# Patient Record
Sex: Male | Born: 1960
Health system: Southern US, Community
[De-identification: ages and names within clinical notes are randomized; demographics above are authoritative.]

## PROBLEM LIST (undated history)

## (undated) DIAGNOSIS — T8859XA Other complications of anesthesia, initial encounter: Secondary | ICD-10-CM

## (undated) DIAGNOSIS — Z955 Presence of coronary angioplasty implant and graft: Secondary | ICD-10-CM

## (undated) DIAGNOSIS — I2510 Atherosclerotic heart disease of native coronary artery without angina pectoris: Secondary | ICD-10-CM

## (undated) DIAGNOSIS — I10 Essential (primary) hypertension: Secondary | ICD-10-CM

## (undated) DIAGNOSIS — I38 Endocarditis, valve unspecified: Secondary | ICD-10-CM

## (undated) DIAGNOSIS — I5030 Unspecified diastolic (congestive) heart failure: Secondary | ICD-10-CM

## (undated) DIAGNOSIS — N261 Atrophy of kidney (terminal): Secondary | ICD-10-CM

## (undated) DIAGNOSIS — E785 Hyperlipidemia, unspecified: Secondary | ICD-10-CM

## (undated) DIAGNOSIS — I219 Acute myocardial infarction, unspecified: Secondary | ICD-10-CM

## (undated) DIAGNOSIS — M51379 Other intervertebral disc degeneration, lumbosacral region without mention of lumbar back pain or lower extremity pain: Secondary | ICD-10-CM

## (undated) DIAGNOSIS — Z7901 Long term (current) use of anticoagulants: Secondary | ICD-10-CM

## (undated) DIAGNOSIS — G479 Sleep disorder, unspecified: Secondary | ICD-10-CM

## (undated) DIAGNOSIS — M5137 Other intervertebral disc degeneration, lumbosacral region: Secondary | ICD-10-CM

## (undated) DIAGNOSIS — N400 Enlarged prostate without lower urinary tract symptoms: Secondary | ICD-10-CM

## (undated) HISTORY — PX: CORONARY ANGIOPLASTY: SHX604

## (undated) HISTORY — PX: ANGIOPLASTY / STENTING ILIAC: SUR31

## (undated) HISTORY — PX: COLONOSCOPY WITH ESOPHAGOGASTRODUODENOSCOPY (EGD): SHX5779

## (undated) HISTORY — PX: KNEE ARTHROSCOPY: SHX127

---

## 1995-02-08 HISTORY — PX: MENISCECTOMY: SHX123

## 2001-07-12 DIAGNOSIS — I2109 ST elevation (STEMI) myocardial infarction involving other coronary artery of anterior wall: Secondary | ICD-10-CM

## 2001-07-12 HISTORY — DX: ST elevation (STEMI) myocardial infarction involving other coronary artery of anterior wall: I21.09

## 2001-07-12 HISTORY — PX: CORONARY ANGIOPLASTY WITH STENT PLACEMENT: SHX49

## 2005-11-25 HISTORY — PX: CORONARY ANGIOPLASTY WITH STENT PLACEMENT: SHX49

## 2005-12-23 ENCOUNTER — Ambulatory Visit: Payer: Self-pay | Admitting: Internal Medicine

## 2005-12-23 IMAGING — CR ORBITS FOR FOREIGN BODY - 2 VIEW
1 series · 3 of 3 positions shown · non-contrast
Comparison: none

REASON FOR EXAM: R/O ANY METAL IN EYES
COMMENTS:

PROCEDURE:     DXR - DXR ORBITS FOR MRI CLEARANCE  - [DATE] [DATE]
RESULT:     Three views of the orbits were obtained prior to MRI
examination.  No metallic foreign body or other abnormality is seen that
would preclude MRI evaluation.

[Series 1: view not recorded · 0.17mm/px · 3 of 3 slices shown]
[im 1/3]
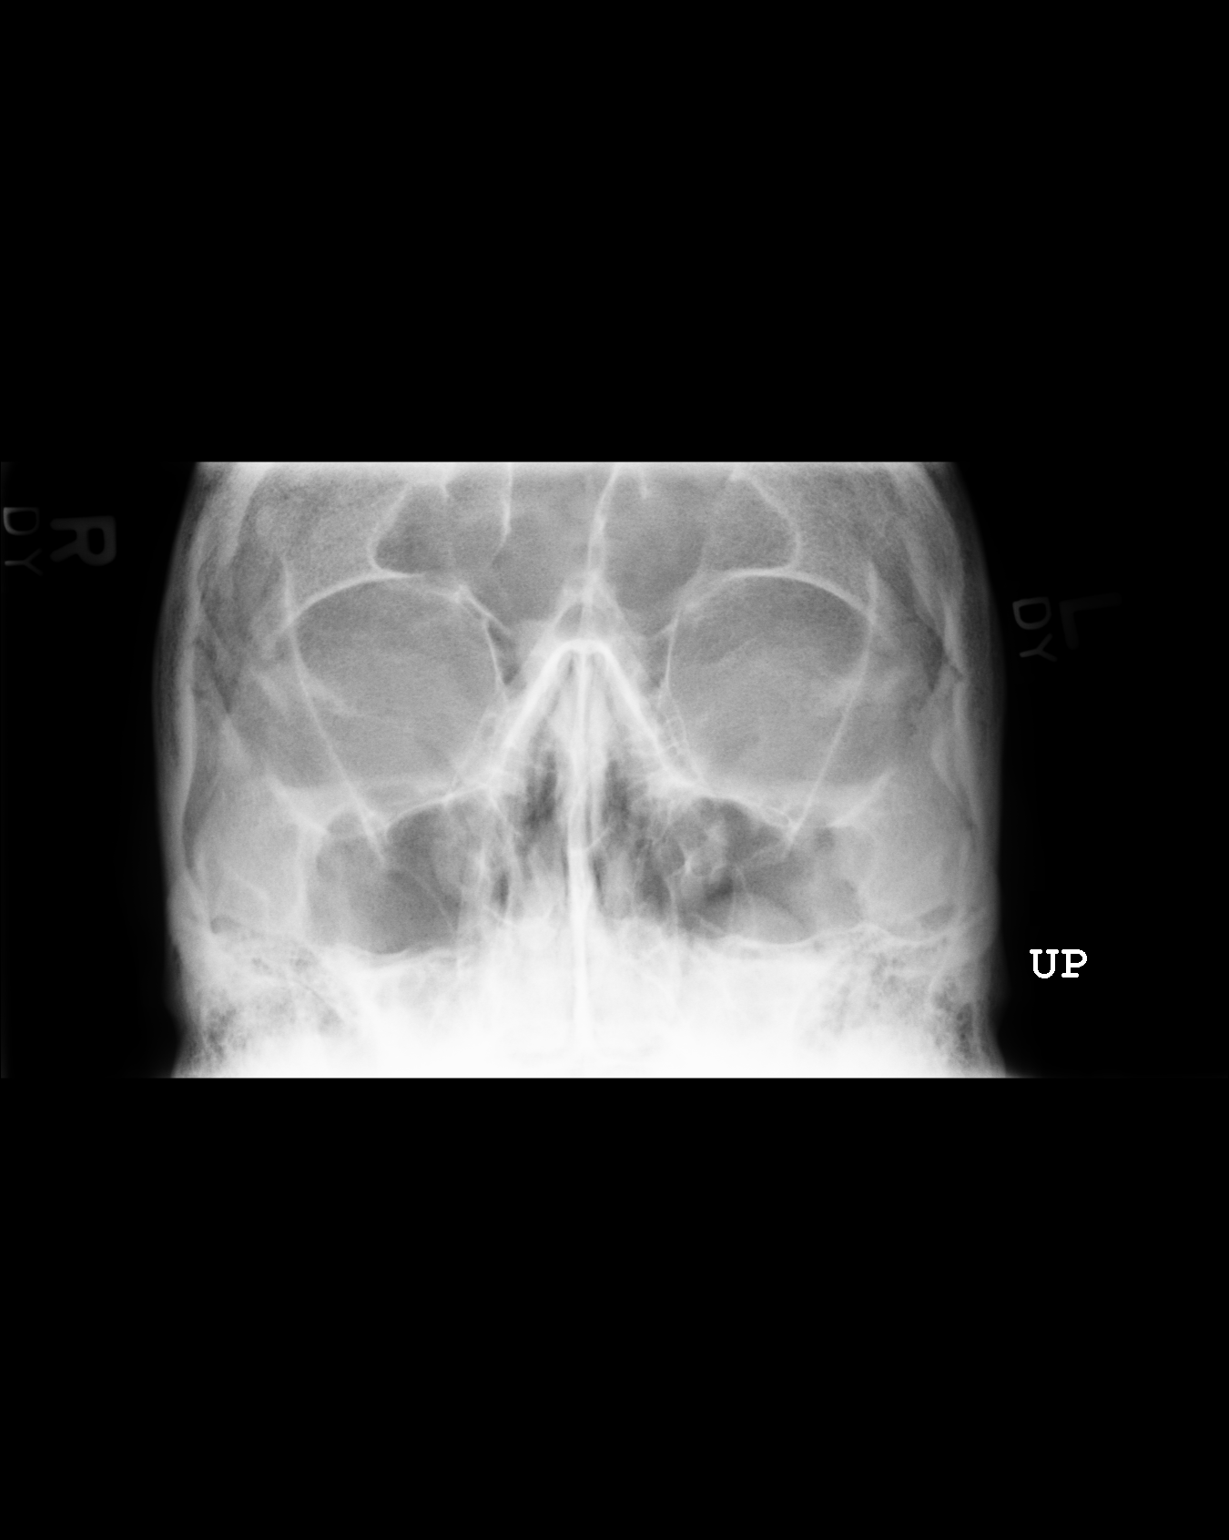
[im 2/3]
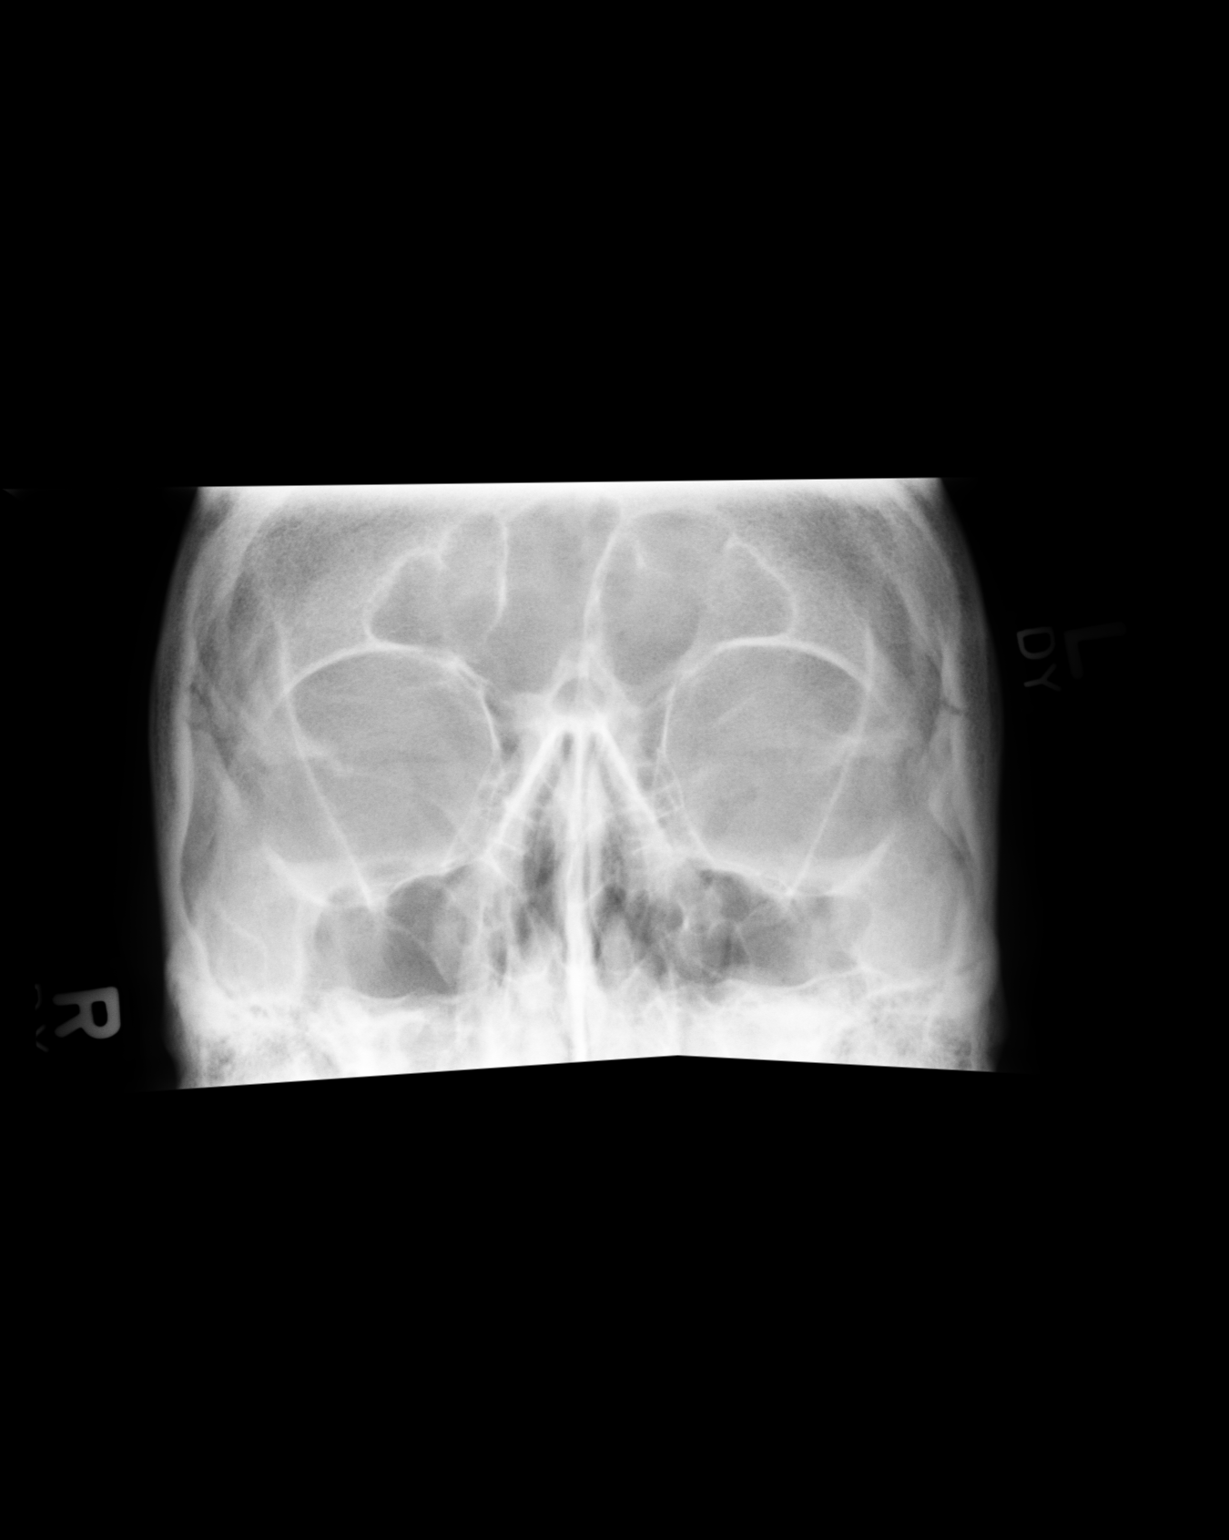
[im 3/3]
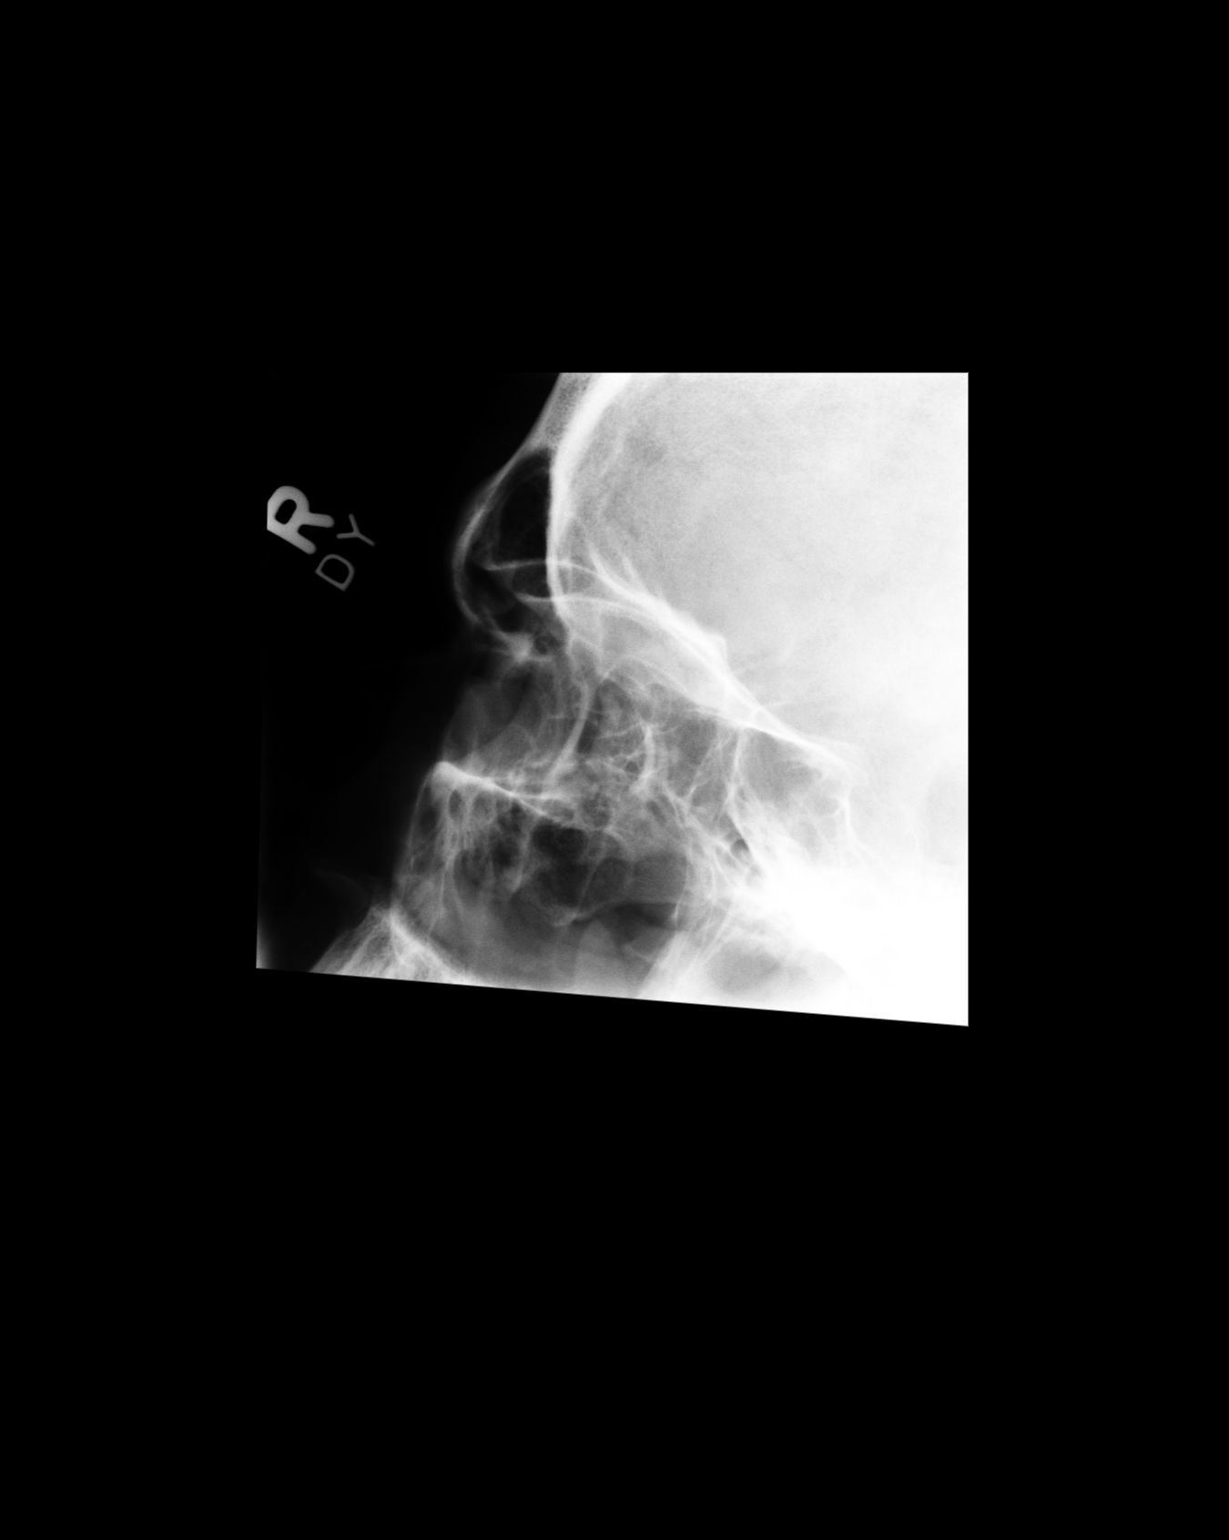

[3 of 3 positions shown; findings below may reference images not displayed]

IMPRESSION: Normal study.

## 2007-02-22 HISTORY — PX: CORONARY ANGIOPLASTY WITH STENT PLACEMENT: SHX49

## 2007-09-19 HISTORY — PX: CORONARY ANGIOPLASTY WITH STENT PLACEMENT: SHX49

## 2007-09-28 ENCOUNTER — Encounter: Payer: Self-pay | Admitting: Internal Medicine

## 2007-10-10 ENCOUNTER — Encounter: Payer: Self-pay | Admitting: Internal Medicine

## 2010-01-09 ENCOUNTER — Ambulatory Visit: Payer: Self-pay | Admitting: Unknown Physician Specialty

## 2010-01-09 IMAGING — CT CT CHEST W/ CM
1 of 2 series · 14 of 32 positions shown, 18 images · non-contrast
Comparison: none

REASON FOR EXAM: COMMENTS:

[Series 3: lung windows · axial · 0.88mm/px · z∈[-1191,-916]mm · 14 of 65 slices shown, 18 images]
[im 5/65  mediastinal]
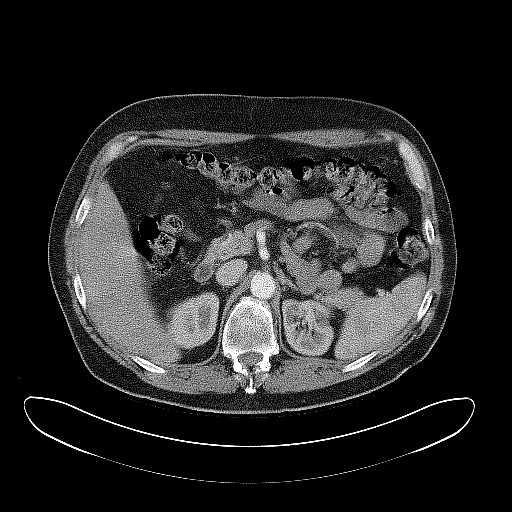
[im 5/65  lung]
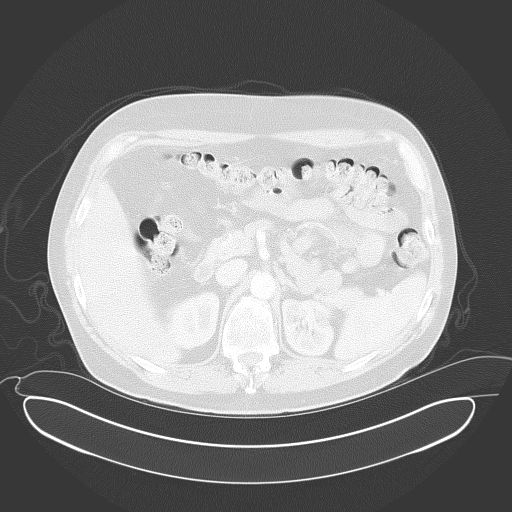
[im 10/65  lung]
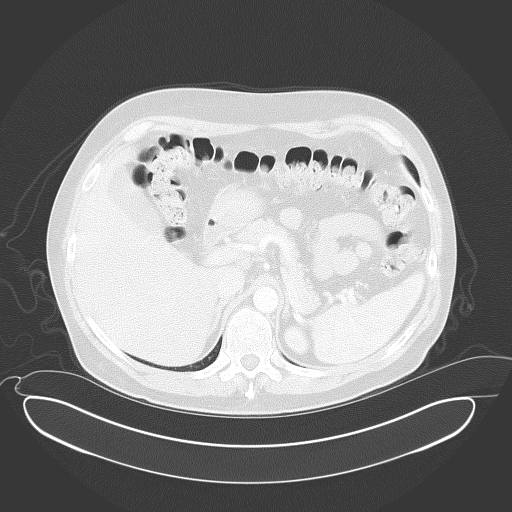
[im 15/65  lung]
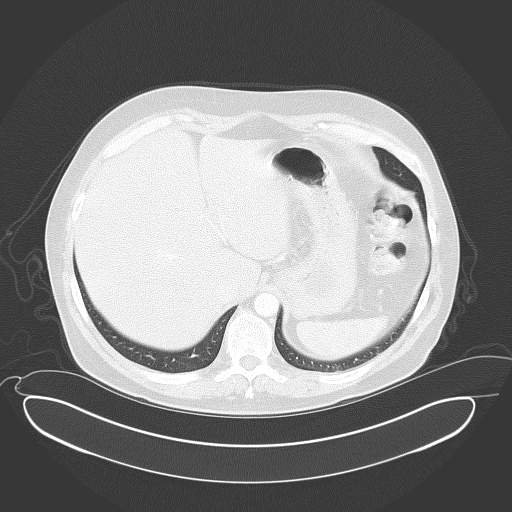
[im 20/65  lung]
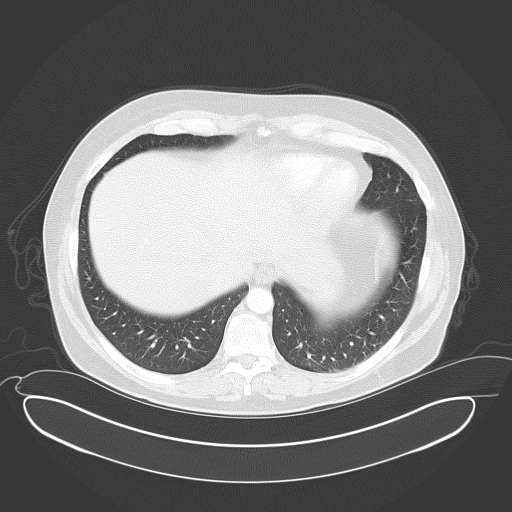
[im 25/65  mediastinal]
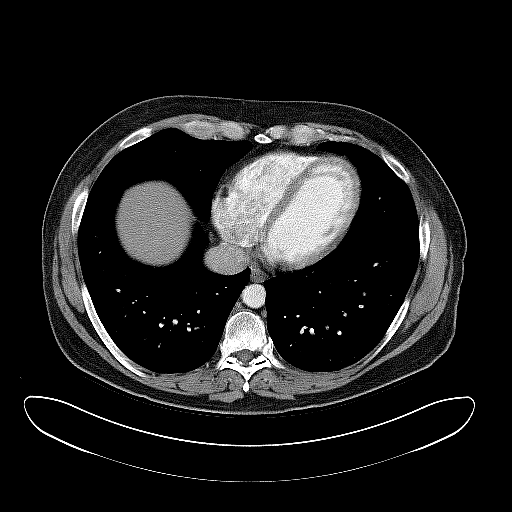
[im 25/65  lung]
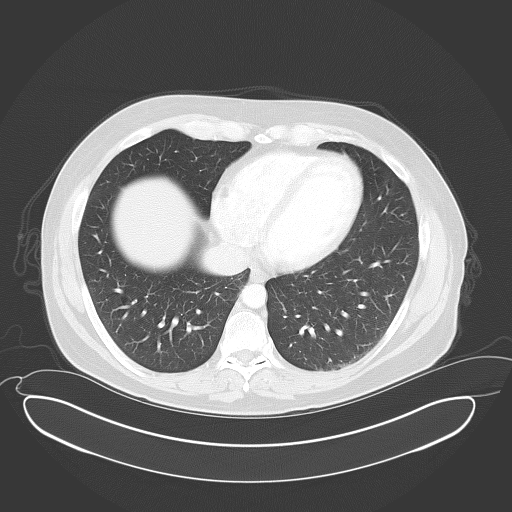
[im 30/65  lung]
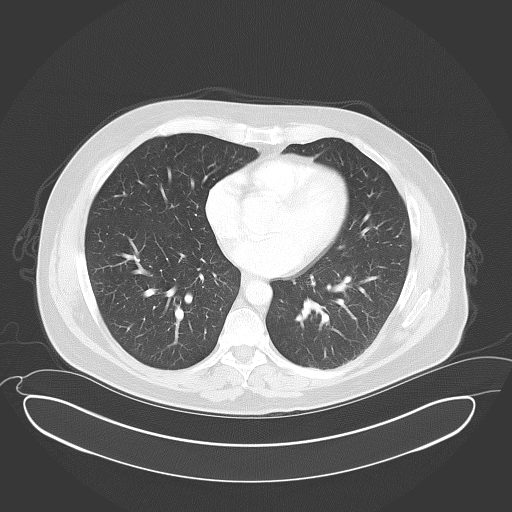
[im 31/65  lung]
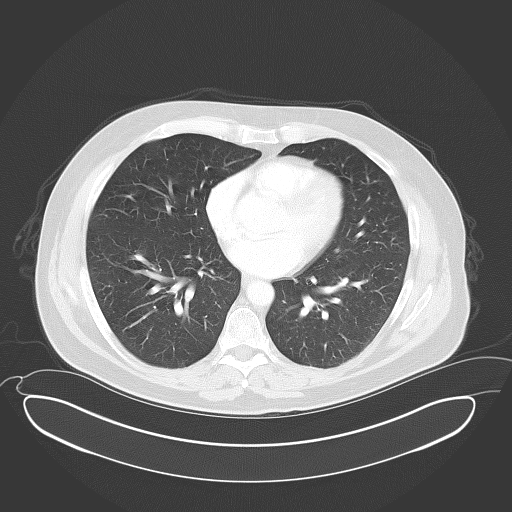
[im 33/65  lung]
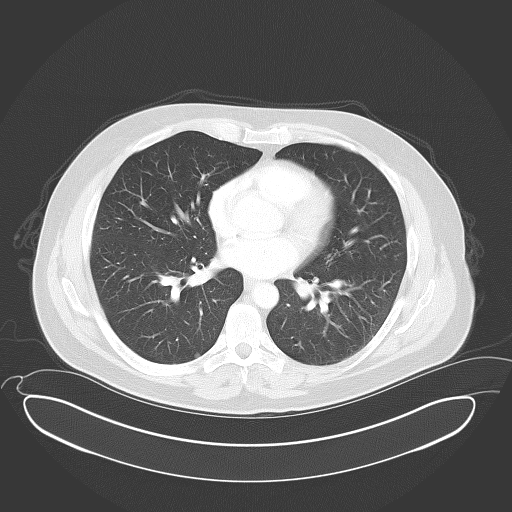
[im 35/65  mediastinal]
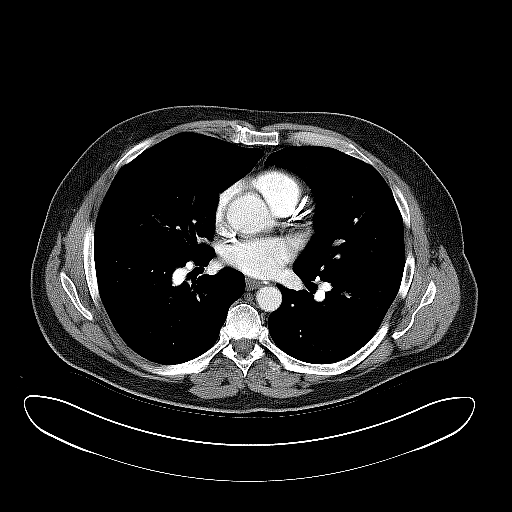
[im 35/65  lung]
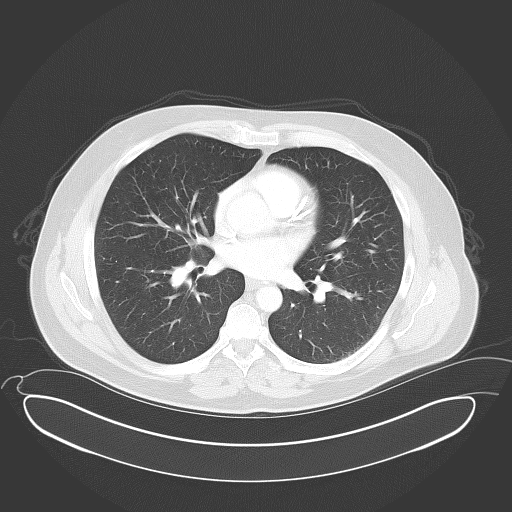
[im 40/65  lung]
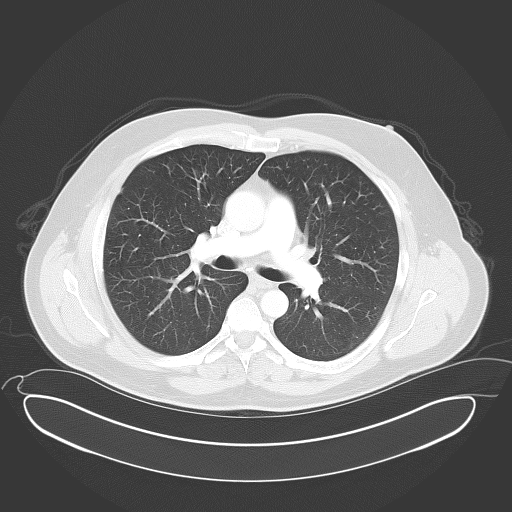
[im 45/65  lung]
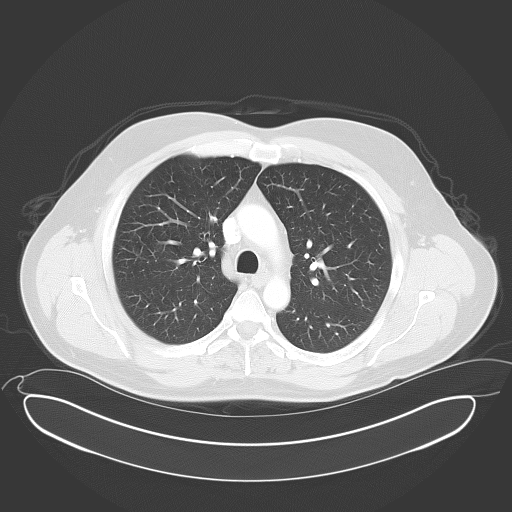
[im 50/65  lung]
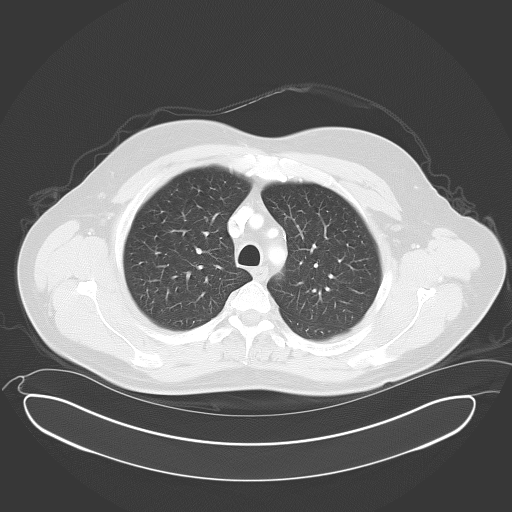
[im 55/65  mediastinal]
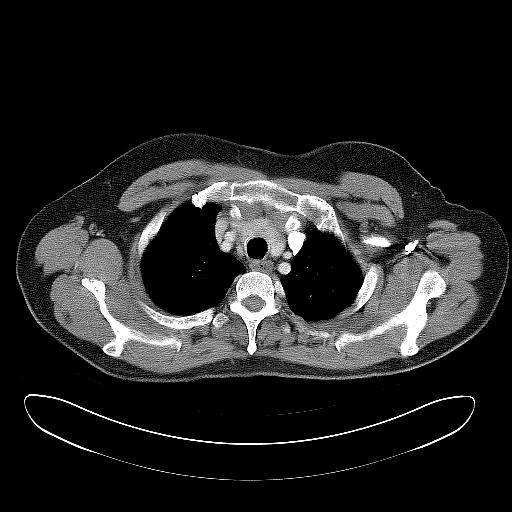
[im 55/65  lung]
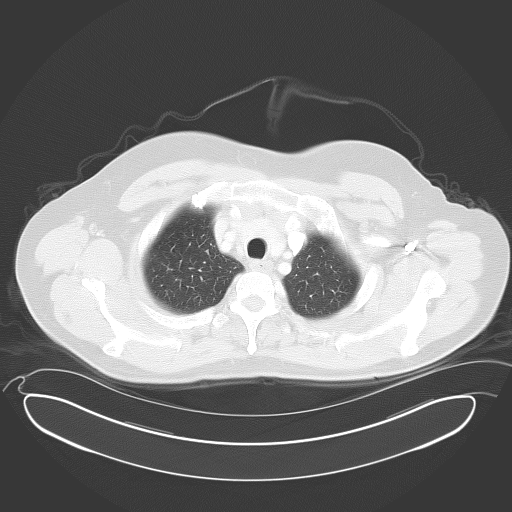
[im 60/65  lung]
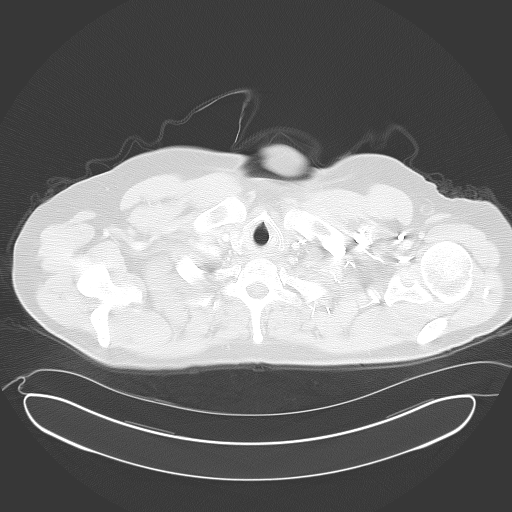

[14 of 32 positions shown; findings below may reference images not displayed]

PROCEDURE:     CT  - CT CHEST WITH CONTRAST  - [DATE]  [DATE]

RESULT:      Axial CT scanning was performed through the chest at 5 mm
intervals and slice thicknesses following intravenous administration of 75
cc of [C0]. Review of 3-dimensional reconstructed images was performed
separately on the via monitor.

At lung window settings on image 26 there is a subtle subcentimeter pleural
based area of nodularity in the right middle lobe anterolaterally. On image
33 there is an approximately 5 mm diameter subpleural soft tissue density
nodule. This is demonstrated best on the CAD image #166. On the left on
image 29 there is an approximately 6 x 8 mm diameter soft tissue density
nodule demonstrated in the extreme inferior posterior aspect of the left
upper lobe just anterior to the major fissure. On image 30 there is a
subcentimeter subpleural nodule in the anterolateral aspect of the left
upper lobe. On image 38 in the left lower lobe there is a 4 mm diameter
faint soft tissue density nodule which is demonstrated best on image #192.

I see no interstitial nor alveolar infiltrates. Within the mediastinum the
cardiac chambers are normal in size. The caliber of the thoracic aorta is
normal. There is no pleural nor pericardial effusion. The thoracic esophagus
appears normal. The hilar prominence demonstrated on the previous chest
x-ray is felt to be related to normal right and left main pulmonary artery.
There is no lymphadenopathy. There are normal sized calcified subcarinal
lymph nodes present consistent with prior granulomatous infection. The
thoracic vertebral bodies are preserved in height.

Within the upper abdomen the observed portions of the liver are normal. I
see no gallstones nor evidence of adrenal masses. There are punctate
calcifications in the spleen consistent with prior granulomatous infection.
The partially imaged left kidney is smaller than the right.
IMPRESSION: 1. I do not see suspicious hilar masses.
2. There is evidence of prior granulomatous infection manifested by
calcified normal sized subcarinal lymph nodes and calcified splenic nodules.
The prior infection likely explains the tiny nonspecific nodules noted in
the lung parenchyma and along the pleural surfaces described above.

I feel would be prudent for the patient to undergo a 6 month followup CT
scan of the chest to allow reassessment of these pulmonary nodules. This
could be performed without IV contrast to limit exposure the kidneys to such
contrast. In the interim, a followup chest x-ray in approximately 3 months
would be of value.

Addendum: Review of the images at bone window settings reveals no lytic or
blastic lesions. There are mild degenerative changes of the lower thoracic
spine. If the patient's bone pain persists, nuclear bone scanning may be a
useful next step.

## 2014-03-26 DIAGNOSIS — J309 Allergic rhinitis, unspecified: Secondary | ICD-10-CM | POA: Insufficient documentation

## 2015-11-01 ENCOUNTER — Other Ambulatory Visit: Payer: Self-pay | Admitting: Internal Medicine

## 2015-11-01 DIAGNOSIS — M5136 Other intervertebral disc degeneration, lumbar region: Secondary | ICD-10-CM | POA: Diagnosis not present

## 2015-11-01 DIAGNOSIS — M79604 Pain in right leg: Secondary | ICD-10-CM

## 2015-11-01 DIAGNOSIS — I10 Essential (primary) hypertension: Secondary | ICD-10-CM | POA: Diagnosis not present

## 2015-11-01 DIAGNOSIS — I2510 Atherosclerotic heart disease of native coronary artery without angina pectoris: Secondary | ICD-10-CM | POA: Diagnosis not present

## 2015-11-08 ENCOUNTER — Ambulatory Visit: Payer: Managed Care, Other (non HMO)

## 2015-12-05 DIAGNOSIS — K6289 Other specified diseases of anus and rectum: Secondary | ICD-10-CM | POA: Diagnosis not present

## 2015-12-24 ENCOUNTER — Encounter: Payer: Self-pay | Admitting: *Deleted

## 2015-12-25 ENCOUNTER — Encounter: Payer: Self-pay | Admitting: *Deleted

## 2015-12-25 ENCOUNTER — Ambulatory Visit
Admission: RE | Admit: 2015-12-25 | Discharge: 2015-12-25 | Disposition: A | Discharge status: Home / Self Care | Payer: Managed Care, Other (non HMO) | Source: Ambulatory Visit | Attending: Unknown Physician Specialty | Admitting: Unknown Physician Specialty

## 2015-12-25 ENCOUNTER — Encounter
Admission: RE | Disposition: A | Discharge status: Home / Self Care | Payer: Self-pay | Source: Ambulatory Visit | Attending: Unknown Physician Specialty

## 2015-12-25 DIAGNOSIS — I252 Old myocardial infarction: Secondary | ICD-10-CM | POA: Insufficient documentation

## 2015-12-25 DIAGNOSIS — D127 Benign neoplasm of rectosigmoid junction: Secondary | ICD-10-CM | POA: Insufficient documentation

## 2015-12-25 DIAGNOSIS — Z8371 Family history of colonic polyps: Secondary | ICD-10-CM | POA: Diagnosis not present

## 2015-12-25 DIAGNOSIS — Z86010 Personal history of colonic polyps: Secondary | ICD-10-CM | POA: Diagnosis not present

## 2015-12-25 DIAGNOSIS — Z7982 Long term (current) use of aspirin: Secondary | ICD-10-CM | POA: Insufficient documentation

## 2015-12-25 DIAGNOSIS — Z79899 Other long term (current) drug therapy: Secondary | ICD-10-CM | POA: Insufficient documentation

## 2015-12-25 DIAGNOSIS — K648 Other hemorrhoids: Secondary | ICD-10-CM | POA: Diagnosis not present

## 2015-12-25 DIAGNOSIS — Z1211 Encounter for screening for malignant neoplasm of colon: Secondary | ICD-10-CM | POA: Diagnosis not present

## 2015-12-25 DIAGNOSIS — E785 Hyperlipidemia, unspecified: Secondary | ICD-10-CM | POA: Diagnosis not present

## 2015-12-25 DIAGNOSIS — K640 First degree hemorrhoids: Secondary | ICD-10-CM | POA: Insufficient documentation

## 2015-12-25 DIAGNOSIS — Z87891 Personal history of nicotine dependence: Secondary | ICD-10-CM | POA: Insufficient documentation

## 2015-12-25 DIAGNOSIS — K6289 Other specified diseases of anus and rectum: Secondary | ICD-10-CM | POA: Diagnosis not present

## 2015-12-25 DIAGNOSIS — I10 Essential (primary) hypertension: Secondary | ICD-10-CM | POA: Insufficient documentation

## 2015-12-25 DIAGNOSIS — I2510 Atherosclerotic heart disease of native coronary artery without angina pectoris: Secondary | ICD-10-CM | POA: Insufficient documentation

## 2015-12-25 DIAGNOSIS — Z955 Presence of coronary angioplasty implant and graft: Secondary | ICD-10-CM | POA: Insufficient documentation

## 2015-12-25 DIAGNOSIS — M5136 Other intervertebral disc degeneration, lumbar region: Secondary | ICD-10-CM | POA: Insufficient documentation

## 2015-12-25 DIAGNOSIS — K635 Polyp of colon: Secondary | ICD-10-CM | POA: Diagnosis not present

## 2015-12-25 HISTORY — DX: Hyperlipidemia, unspecified: E78.5

## 2015-12-25 HISTORY — PX: COLONOSCOPY WITH PROPOFOL: SHX5780

## 2015-12-25 HISTORY — DX: Atherosclerotic heart disease of native coronary artery without angina pectoris: I25.10

## 2015-12-25 HISTORY — DX: Other intervertebral disc degeneration, lumbosacral region: M51.37

## 2015-12-25 HISTORY — DX: Acute myocardial infarction, unspecified: I21.9

## 2015-12-25 HISTORY — DX: Essential (primary) hypertension: I10

## 2015-12-25 HISTORY — DX: Other intervertebral disc degeneration, lumbosacral region without mention of lumbar back pain or lower extremity pain: M51.379

## 2015-12-25 SURGERY — COLONOSCOPY WITH PROPOFOL
Anesthesia: General

## 2015-12-25 MED ORDER — SODIUM CHLORIDE 0.9 % IV SOLN
INTRAVENOUS | Status: DC
  Administered 2015-12-25: 07:00:00 via INTRAVENOUS

## 2015-12-25 MED ORDER — FENTANYL CITRATE (PF) 100 MCG/2ML IJ SOLN
INTRAMUSCULAR | Status: DC | PRN
  Administered 2015-12-25: 75 ug via INTRAVENOUS
  Administered 2015-12-25: 25 ug via INTRAVENOUS

## 2015-12-25 MED ORDER — MIDAZOLAM HCL 5 MG/5ML IJ SOLN
INTRAMUSCULAR | Status: DC | PRN
  Administered 2015-12-25: 3 mg via INTRAVENOUS
  Administered 2015-12-25 (×2): 1 mg via INTRAVENOUS

## 2015-12-25 MED ORDER — SODIUM CHLORIDE 0.9 % IV SOLN: INTRAVENOUS | Status: DC

## 2015-12-25 MED ORDER — FENTANYL CITRATE (PF) 100 MCG/2ML IJ SOLN
INTRAMUSCULAR | Status: AC
  Filled 2015-12-25: qty 2

## 2015-12-25 MED ORDER — MIDAZOLAM HCL 5 MG/5ML IJ SOLN
INTRAMUSCULAR | Status: AC
  Filled 2015-12-25: qty 10

## 2015-12-25 MED ORDER — PROMETHAZINE HCL 25 MG/ML IJ SOLN
INTRAMUSCULAR | Status: AC
  Filled 2015-12-25: qty 1

## 2015-12-25 MED ORDER — PROMETHAZINE HCL 25 MG/ML IJ SOLN
INTRAMUSCULAR | Status: DC | PRN
Start: 2015-12-25 — End: 2015-12-25
  Administered 2015-12-25: 12.5 mg via INTRAVENOUS

## 2015-12-25 NOTE — Op Note (Signed)
[facility] Patient Name: Luke Glenn Procedure Date: 12/25/2015 7:38 AM MRN: [id number] Account #: [id number] Date of Birth: 1961-03-05 Admit Type: Outpatient Age: 55 Room: [facility] ENDO ROOM 4 Gender: Male Note Status: Finalized Procedure:            Colonoscopy Indications:          Colon cancer screening in patient at increased risk:                        Family history of 1st-degree relative with colon polyps Providers:            Manya Silvas, MD Referring MD:         Ramonita Lab, MD (Referring MD) Medicines:            Fentanyl 100 micrograms IV, Midazolam 5 mg IV,                        Promethazine [id number] mg IV Complications:        No immediate complications. Procedure:            Pre-Anesthesia Assessment:                       - After reviewing the risks and benefits, the patient                        was deemed in satisfactory condition to undergo the                        procedure.                       After obtaining informed consent, the colonoscope was                        passed under direct vision. Throughout the procedure,                        the patient's blood pressure, pulse, and oxygen                        saturations were monitored continuously. The                        Colonoscope was introduced through the anus and                        advanced to the the cecum, identified by appendiceal                        orifice and ileocecal valve. The colonoscopy was                        performed without difficulty. The patient tolerated the                        procedure well. The quality of the bowel preparation                        was excellent. Findings:      A 5 mm polyp was found in the recto-sigmoid colon. The  polyp was       sessile. The polyp was removed with a hot snare. Resection and retrieval       were complete.      A 4 mm polyp was found in the recto-sigmoid colon. The polyp was   sessile. The polyp was removed with a hot snare. Resection and retrieval       were complete.      Internal hemorrhoids were found during endoscopy. The hemorrhoids were       small and Grade I (internal hemorrhoids that do not prolapse).      The exam was otherwise without abnormality. Impression:           - One 5 mm polyp at the recto-sigmoid colon, removed                        with a hot snare. Resected and retrieved.                       - One 4 mm polyp at the recto-sigmoid colon, removed                        with a hot snare. Resected and retrieved.                       - Internal hemorrhoids.                       - The examination was otherwise normal. Recommendation:       - Await pathology results. Manya Silvas, MD 12/25/2015 8:25:25 AM This report has been signed electronically. Number of Addenda: 0 Note Initiated On: 12/25/2015 7:38 AM Scope Withdrawal Time: 0 hours 22 minutes 25 seconds  Total Procedure Duration: 0 hours 30 minutes 23 seconds       [facility]

## 2015-12-25 NOTE — H&P (Signed)
Primary Care Physician:  Adin Hector, MD Primary Gastroenterologist:  Dr. Vira Agar  Pre-Procedure History & Physical: HPI:  Luke Glenn is a 55 y.o. male is here for an colonoscopy.   Past Medical History  Diagnosis Date  . Coronary artery disease   . DDD (degenerative disc disease), lumbosacral   . Hypertension   . Elevated lipids   . Myocardial infarction [facility])     Past Surgical History  Procedure Laterality Date  . Angioplasty / stenting iliac    . Colonoscopy with esophagogastroduodenoscopy (egd)      Prior to Admission medications   Medication Sig Start Date End Date Taking? Authorizing Provider  aspirin 81 MG tablet 81 mg daily.   Yes Historical Provider, MD  azelastine (ASTELIN) 0.1 % nasal spray Place 1 spray into both nostrils 2 (two) times daily. Use in each nostril as directed   Yes Historical Provider, MD  calcium-vitamin D (OSCAL WITH D) 500-200 MG-UNIT tablet Take 1 tablet by mouth.   Yes Historical Provider, MD  cetirizine (ZYRTEC) 10 MG tablet Take 10 mg by mouth daily.   Yes Historical Provider, MD  clopidogrel (PLAVIX) 75 MG tablet Take 75 mg by mouth daily.   Yes Historical Provider, MD  ezetimibe-simvastatin (VYTORIN) 10-80 MG tablet Take 1 tablet by mouth daily.   Yes Historical Provider, MD  finasteride (PROPECIA) 1 MG tablet Take 1 mg by mouth daily.   Yes Historical Provider, MD  fluticasone (FLONASE) 50 MCG/ACT nasal spray Place 2 sprays into both nostrils daily.   Yes Historical Provider, MD  glucosamine-chondroitin 500-400 MG tablet Take 1 tablet by mouth 3 (three) times daily.   Yes Historical Provider, MD  lisinopril (PRINIVIL,ZESTRIL) 20 MG tablet Take 20 mg by mouth daily.   Yes Historical Provider, MD  metoprolol succinate (TOPROL-XL) 100 MG 24 hr tablet Take 100 mg by mouth daily. Take with or immediately following a meal.   Yes Historical Provider, MD  Multiple Vitamin (MULTIVITAMIN) capsule Take 1 capsule by mouth daily.   Yes Historical  Provider, MD  niacin 500 MG tablet Take 500 mg by mouth at bedtime.   Yes Historical Provider, MD  omega-3 acid ethyl esters (LOVAZA) 1 g capsule Take by mouth 2 (two) times daily.   Yes Historical Provider, MD    Allergies as of 12/23/2015  . (Not on File)    No family history on file.  Social History   Social History  . Marital Status: Married    Spouse Name: N/A  . Number of Children: N/A  . Years of Education: N/A   Occupational History  . Not on file.   Social History Main Topics  . Smoking status: Former Research scientist (life sciences)  . Smokeless tobacco: Not on file  . Alcohol Use: No  . Drug Use: No  . Sexual Activity: Not on file   Other Topics Concern  . Not on file   Social History Narrative    Review of Systems: See HPI, otherwise negative ROS  Physical Exam: BP 103/67 mmHg  Pulse 69  Temp(Src) 97.3 F (36.3 C) (Tympanic)  Resp 16  Ht 5\' 9"  (1.753 m)  Wt 81.194 kg (179 lb)  BMI 26.42 kg/m2  SpO2 99% General:   Alert,  pleasant and cooperative in NAD Head:  Normocephalic and atraumatic. Neck:  Supple; no masses or thyromegaly. Lungs:  Clear throughout to auscultation.    Heart:  Regular rate and rhythm. Abdomen:  Soft, nontender and nondistended. Normal bowel sounds,  without guarding, and without rebound.   Neurologic:  Alert and  oriented x4;  grossly normal neurologically.  Impression/Plan: Luke Glenn is here for an colonoscopy to be performed for FH colon polyps  Risks, benefits, limitations, and alternatives regarding  colonoscopy have been reviewed with the patient.  Questions have been answered.  All parties agreeable.   Gaylyn Cheers, MD  12/25/2015, 7:35 AM

## 2015-12-26 LAB — SURGICAL PATHOLOGY

## 2015-12-29 ENCOUNTER — Encounter: Payer: Self-pay | Admitting: Unknown Physician Specialty

## 2016-03-17 DIAGNOSIS — I10 Essential (primary) hypertension: Secondary | ICD-10-CM | POA: Diagnosis not present

## 2016-03-17 DIAGNOSIS — D72818 Other decreased white blood cell count: Secondary | ICD-10-CM | POA: Diagnosis not present

## 2016-03-17 DIAGNOSIS — E784 Other hyperlipidemia: Secondary | ICD-10-CM | POA: Diagnosis not present

## 2016-03-24 DIAGNOSIS — D72819 Decreased white blood cell count, unspecified: Secondary | ICD-10-CM | POA: Insufficient documentation

## 2016-03-24 DIAGNOSIS — M5136 Other intervertebral disc degeneration, lumbar region: Secondary | ICD-10-CM | POA: Diagnosis not present

## 2016-03-24 DIAGNOSIS — E784 Other hyperlipidemia: Secondary | ICD-10-CM | POA: Diagnosis not present

## 2016-03-24 DIAGNOSIS — I2510 Atherosclerotic heart disease of native coronary artery without angina pectoris: Secondary | ICD-10-CM | POA: Diagnosis not present

## 2016-03-24 DIAGNOSIS — I10 Essential (primary) hypertension: Secondary | ICD-10-CM | POA: Diagnosis not present

## 2016-09-01 DIAGNOSIS — Z23 Encounter for immunization: Secondary | ICD-10-CM | POA: Diagnosis not present

## 2016-09-17 DIAGNOSIS — D72818 Other decreased white blood cell count: Secondary | ICD-10-CM | POA: Diagnosis not present

## 2016-09-17 DIAGNOSIS — E784 Other hyperlipidemia: Secondary | ICD-10-CM | POA: Diagnosis not present

## 2016-09-17 DIAGNOSIS — I2510 Atherosclerotic heart disease of native coronary artery without angina pectoris: Secondary | ICD-10-CM | POA: Diagnosis not present

## 2016-09-17 DIAGNOSIS — Z125 Encounter for screening for malignant neoplasm of prostate: Secondary | ICD-10-CM | POA: Diagnosis not present

## 2016-09-17 DIAGNOSIS — I10 Essential (primary) hypertension: Secondary | ICD-10-CM | POA: Diagnosis not present

## 2016-09-23 DIAGNOSIS — Z23 Encounter for immunization: Secondary | ICD-10-CM | POA: Diagnosis not present

## 2016-09-24 DIAGNOSIS — E784 Other hyperlipidemia: Secondary | ICD-10-CM | POA: Diagnosis not present

## 2016-09-24 DIAGNOSIS — Z0001 Encounter for general adult medical examination with abnormal findings: Secondary | ICD-10-CM | POA: Diagnosis not present

## 2016-09-24 DIAGNOSIS — M5136 Other intervertebral disc degeneration, lumbar region: Secondary | ICD-10-CM | POA: Diagnosis not present

## 2016-09-24 DIAGNOSIS — I10 Essential (primary) hypertension: Secondary | ICD-10-CM | POA: Diagnosis not present

## 2017-03-17 DIAGNOSIS — I10 Essential (primary) hypertension: Secondary | ICD-10-CM | POA: Diagnosis not present

## 2017-03-24 DIAGNOSIS — I10 Essential (primary) hypertension: Secondary | ICD-10-CM | POA: Diagnosis not present

## 2017-03-24 DIAGNOSIS — I2510 Atherosclerotic heart disease of native coronary artery without angina pectoris: Secondary | ICD-10-CM | POA: Diagnosis not present

## 2017-03-24 DIAGNOSIS — E784 Other hyperlipidemia: Secondary | ICD-10-CM | POA: Diagnosis not present

## 2017-03-24 DIAGNOSIS — M5136 Other intervertebral disc degeneration, lumbar region: Secondary | ICD-10-CM | POA: Diagnosis not present

## 2017-09-09 DIAGNOSIS — Z23 Encounter for immunization: Secondary | ICD-10-CM | POA: Diagnosis not present

## 2017-09-29 DIAGNOSIS — Z125 Encounter for screening for malignant neoplasm of prostate: Secondary | ICD-10-CM | POA: Diagnosis not present

## 2017-09-29 DIAGNOSIS — I2510 Atherosclerotic heart disease of native coronary artery without angina pectoris: Secondary | ICD-10-CM | POA: Diagnosis not present

## 2017-09-29 DIAGNOSIS — I10 Essential (primary) hypertension: Secondary | ICD-10-CM | POA: Diagnosis not present

## 2017-09-29 DIAGNOSIS — E7849 Other hyperlipidemia: Secondary | ICD-10-CM | POA: Diagnosis not present

## 2017-10-06 DIAGNOSIS — I2510 Atherosclerotic heart disease of native coronary artery without angina pectoris: Secondary | ICD-10-CM | POA: Diagnosis not present

## 2017-10-06 DIAGNOSIS — Z0000 Encounter for general adult medical examination without abnormal findings: Secondary | ICD-10-CM | POA: Diagnosis not present

## 2017-10-06 DIAGNOSIS — I10 Essential (primary) hypertension: Secondary | ICD-10-CM | POA: Diagnosis not present

## 2017-10-06 DIAGNOSIS — M5136 Other intervertebral disc degeneration, lumbar region: Secondary | ICD-10-CM | POA: Diagnosis not present

## 2017-10-14 DIAGNOSIS — I519 Heart disease, unspecified: Secondary | ICD-10-CM | POA: Insufficient documentation

## 2017-10-14 DIAGNOSIS — I2510 Atherosclerotic heart disease of native coronary artery without angina pectoris: Secondary | ICD-10-CM | POA: Diagnosis not present

## 2017-10-14 DIAGNOSIS — I5189 Other ill-defined heart diseases: Secondary | ICD-10-CM | POA: Insufficient documentation

## 2017-10-15 DIAGNOSIS — E7800 Pure hypercholesterolemia, unspecified: Secondary | ICD-10-CM | POA: Diagnosis not present

## 2017-10-15 DIAGNOSIS — I2510 Atherosclerotic heart disease of native coronary artery without angina pectoris: Secondary | ICD-10-CM | POA: Diagnosis not present

## 2017-10-15 DIAGNOSIS — I429 Cardiomyopathy, unspecified: Secondary | ICD-10-CM | POA: Diagnosis not present

## 2017-10-15 DIAGNOSIS — I10 Essential (primary) hypertension: Secondary | ICD-10-CM | POA: Diagnosis not present

## 2018-06-03 ENCOUNTER — Other Ambulatory Visit: Payer: Self-pay | Admitting: Physical Medicine and Rehabilitation

## 2018-06-03 DIAGNOSIS — M5416 Radiculopathy, lumbar region: Secondary | ICD-10-CM

## 2018-06-10 ENCOUNTER — Other Ambulatory Visit: Payer: Self-pay | Admitting: Family Medicine

## 2018-06-10 DIAGNOSIS — G8929 Other chronic pain: Secondary | ICD-10-CM

## 2018-06-10 DIAGNOSIS — M545 Low back pain, unspecified: Secondary | ICD-10-CM

## 2018-06-10 DIAGNOSIS — Z77018 Contact with and (suspected) exposure to other hazardous metals: Secondary | ICD-10-CM

## 2018-06-13 ENCOUNTER — Ambulatory Visit
Admission: RE | Admit: 2018-06-13 | Discharge: 2018-06-13 | Disposition: A | Discharge status: Home / Self Care | Payer: 59 | Source: Ambulatory Visit | Attending: Family Medicine | Admitting: Family Medicine

## 2018-06-13 DIAGNOSIS — Z77018 Contact with and (suspected) exposure to other hazardous metals: Secondary | ICD-10-CM

## 2018-06-15 ENCOUNTER — Ambulatory Visit
Admission: RE | Admit: 2018-06-15 | Discharge: 2018-06-15 | Disposition: A | Discharge status: Home / Self Care | Payer: 59 | Source: Ambulatory Visit | Attending: Family Medicine | Admitting: Family Medicine

## 2018-06-15 DIAGNOSIS — M545 Low back pain: Principal | ICD-10-CM

## 2018-06-15 DIAGNOSIS — G8929 Other chronic pain: Secondary | ICD-10-CM

## 2018-06-17 ENCOUNTER — Ambulatory Visit: Admission: RE | Admit: 2018-06-17 | Payer: Managed Care, Other (non HMO) | Source: Ambulatory Visit

## 2018-06-27 DIAGNOSIS — N261 Atrophy of kidney (terminal): Secondary | ICD-10-CM | POA: Insufficient documentation

## 2018-07-19 ENCOUNTER — Ambulatory Visit: Payer: 59 | Admitting: Urology

## 2018-07-19 ENCOUNTER — Encounter: Payer: Self-pay | Admitting: Urology

## 2018-07-19 VITALS — BP 103/53 | HR 67 | Ht 69.0 in | Wt 179.5 lb

## 2018-07-19 DIAGNOSIS — N261 Atrophy of kidney (terminal): Secondary | ICD-10-CM

## 2018-07-19 NOTE — Progress Notes (Addendum)
   07/19/2018 1:58 PM   [name] [birth year] [id number]  Referring provider: Adin Hector, MD [facility] [place], [place] [address]  CC: Left renal atrophy  HPI: I had the pleasure of seeing Mr. Kyllo in urology clinic today in consultation for possible left renal atrophy from Dr. Caryl Comes.  Mr. Tozzi is a 57 year old male with cardiac history with cardiac stents on anticoagulation found to have left renal atrophy on a recent MRI performed for back pain.  Upon review of old imaging this appeared similar to a CT chest with part of the kidneys image from 2011.  He has chronic right-sided back pain however has never had any pain on his left flank.  He denies gross hematuria, UTIs, or weight loss.  He quit smoking in 2002.  He has a 20-pack-year smoking history.  He does have a family history of bladder cancer.  Renal function is normal with creatinine of 0.8, and eGFR of 100.   PMH: Past Medical History:  Diagnosis Date  . Coronary artery disease   . DDD (degenerative disc disease), lumbosacral   . Elevated lipids   . Hypertension   . Myocardial infarction     Surgical History: Past Surgical History:  Procedure Laterality Date  . ANGIOPLASTY / STENTING ILIAC    . COLONOSCOPY WITH ESOPHAGOGASTRODUODENOSCOPY (EGD)    . COLONOSCOPY WITH PROPOFOL N/A 12/25/2015   Procedure: COLONOSCOPY WITH PROPOFOL;  Surgeon: Manya Silvas, MD;  Location: [facility] ENDOSCOPY;  Service: Endoscopy;  Laterality: N/A;     Allergies: No Known Allergies  Family History: No family history on file.  Social History:  reports that he has quit smoking. He does not have any smokeless tobacco history on file. He reports that he does not drink alcohol or use drugs.  ROS: Please see flowsheet from today's date for complete review of systems.  Physical Exam: There were no vitals taken for this visit.   Constitutional:  Alert and oriented, No acute distress. Cardiovascular:  No clubbing, cyanosis, or edema. Respiratory: Normal respiratory effort, no increased work of breathing. GI: Abdomen is soft, nontender, nondistended, no abdominal masses GU: No CVA tenderness Lymph: No cervical or inguinal lymphadenopathy. Skin: No rashes, bruises or suspicious lesions. Neurologic: Grossly intact, no focal deficits, moving all 4 extremities. Psychiatric: Normal mood and affect.  Laboratory Data: Creatinine 03/2018: 0.8, eGFR 100  Urinalysis today 0 WBCs, 0 RBCs, 0 epithelial cells, no bacteria, nitrite negative  Pertinent Imaging: I have personally reviewed the MR lumbar spine 06/2018, and CT chest 01/2010. Mild left renal atrophy noted.  Assessment & Plan:   In summary, Mr. Molinari is a 57 year old male with cardiac history found incidentally to have possible left renal atrophy on MRI for right sided back pain.  He is asymptomatic. I recommended a renal ultrasound to better evaluate the renal parenchyma bilaterally and evaluate for any obstruction.  If hydronephrosis is seen, will consider CT versus nuclear medicine renal scan for further evaluation.  Will call with renal ultrasound results.  ADDENDUM 07/28/2018: Renal ultrasound personally reviewed.  There is no hydronephrosis or urolithiasis bilaterally.  The left kidney is slightly smaller than the right side, however this appears chronic, and does not require further work-up.  I discussed these findings with the patient.  He can follow-up on an as-needed basis.  Billey Co, [facility] [address] [place],  [address] [phone]

## 2018-07-20 LAB — URINALYSIS, COMPLETE
Bilirubin, UA: NEGATIVE
Glucose, UA: NEGATIVE
KETONES UA: NEGATIVE
LEUKOCYTES UA: NEGATIVE
NITRITE UA: NEGATIVE
Protein, UA: NEGATIVE
RBC UA: NEGATIVE
SPEC GRAV UA: 1.01 (ref 1.005–1.030)
Urobilinogen, Ur: 0.2 mg/dL (ref 0.2–1.0)
pH, UA: 6.5 (ref 5.0–7.5)

## 2018-07-27 ENCOUNTER — Ambulatory Visit
Admission: RE | Admit: 2018-07-27 | Discharge: 2018-07-27 | Disposition: A | Discharge status: Home / Self Care | Payer: 59 | Source: Ambulatory Visit | Attending: Urology | Admitting: Urology

## 2018-07-27 DIAGNOSIS — N261 Atrophy of kidney (terminal): Secondary | ICD-10-CM

## 2020-02-12 DIAGNOSIS — M5416 Radiculopathy, lumbar region: Secondary | ICD-10-CM | POA: Diagnosis not present

## 2020-02-12 DIAGNOSIS — M47816 Spondylosis without myelopathy or radiculopathy, lumbar region: Secondary | ICD-10-CM | POA: Diagnosis not present

## 2020-02-12 DIAGNOSIS — M5136 Other intervertebral disc degeneration, lumbar region: Secondary | ICD-10-CM | POA: Diagnosis not present

## 2020-03-21 DIAGNOSIS — M48062 Spinal stenosis, lumbar region with neurogenic claudication: Secondary | ICD-10-CM | POA: Diagnosis not present

## 2020-03-21 DIAGNOSIS — M5416 Radiculopathy, lumbar region: Secondary | ICD-10-CM | POA: Diagnosis not present

## 2020-03-21 DIAGNOSIS — M5136 Other intervertebral disc degeneration, lumbar region: Secondary | ICD-10-CM | POA: Diagnosis not present

## 2020-04-11 DIAGNOSIS — M5416 Radiculopathy, lumbar region: Secondary | ICD-10-CM | POA: Diagnosis not present

## 2020-04-11 DIAGNOSIS — M5136 Other intervertebral disc degeneration, lumbar region: Secondary | ICD-10-CM | POA: Diagnosis not present

## 2020-04-11 DIAGNOSIS — M48062 Spinal stenosis, lumbar region with neurogenic claudication: Secondary | ICD-10-CM | POA: Diagnosis not present

## 2020-05-17 DIAGNOSIS — M5441 Lumbago with sciatica, right side: Secondary | ICD-10-CM | POA: Diagnosis not present

## 2020-05-17 DIAGNOSIS — M9903 Segmental and somatic dysfunction of lumbar region: Secondary | ICD-10-CM | POA: Diagnosis not present

## 2020-05-17 DIAGNOSIS — M4316 Spondylolisthesis, lumbar region: Secondary | ICD-10-CM | POA: Diagnosis not present

## 2020-05-17 DIAGNOSIS — M5136 Other intervertebral disc degeneration, lumbar region: Secondary | ICD-10-CM | POA: Diagnosis not present

## 2020-05-21 DIAGNOSIS — M4316 Spondylolisthesis, lumbar region: Secondary | ICD-10-CM | POA: Diagnosis not present

## 2020-05-21 DIAGNOSIS — M5441 Lumbago with sciatica, right side: Secondary | ICD-10-CM | POA: Diagnosis not present

## 2020-05-21 DIAGNOSIS — M9903 Segmental and somatic dysfunction of lumbar region: Secondary | ICD-10-CM | POA: Diagnosis not present

## 2020-05-21 DIAGNOSIS — M5136 Other intervertebral disc degeneration, lumbar region: Secondary | ICD-10-CM | POA: Diagnosis not present

## 2020-05-24 DIAGNOSIS — M5441 Lumbago with sciatica, right side: Secondary | ICD-10-CM | POA: Diagnosis not present

## 2020-05-24 DIAGNOSIS — M4316 Spondylolisthesis, lumbar region: Secondary | ICD-10-CM | POA: Diagnosis not present

## 2020-05-24 DIAGNOSIS — M9903 Segmental and somatic dysfunction of lumbar region: Secondary | ICD-10-CM | POA: Diagnosis not present

## 2020-05-24 DIAGNOSIS — M5136 Other intervertebral disc degeneration, lumbar region: Secondary | ICD-10-CM | POA: Diagnosis not present

## 2020-05-27 ENCOUNTER — Other Ambulatory Visit: Payer: Self-pay | Admitting: Physical Medicine and Rehabilitation

## 2020-05-28 DIAGNOSIS — M5441 Lumbago with sciatica, right side: Secondary | ICD-10-CM | POA: Diagnosis not present

## 2020-05-28 DIAGNOSIS — M5136 Other intervertebral disc degeneration, lumbar region: Secondary | ICD-10-CM | POA: Diagnosis not present

## 2020-05-28 DIAGNOSIS — M9903 Segmental and somatic dysfunction of lumbar region: Secondary | ICD-10-CM | POA: Diagnosis not present

## 2020-05-28 DIAGNOSIS — M4316 Spondylolisthesis, lumbar region: Secondary | ICD-10-CM | POA: Diagnosis not present

## 2020-05-31 DIAGNOSIS — M9903 Segmental and somatic dysfunction of lumbar region: Secondary | ICD-10-CM | POA: Diagnosis not present

## 2020-05-31 DIAGNOSIS — M5441 Lumbago with sciatica, right side: Secondary | ICD-10-CM | POA: Diagnosis not present

## 2020-05-31 DIAGNOSIS — M4316 Spondylolisthesis, lumbar region: Secondary | ICD-10-CM | POA: Diagnosis not present

## 2020-05-31 DIAGNOSIS — M5136 Other intervertebral disc degeneration, lumbar region: Secondary | ICD-10-CM | POA: Diagnosis not present

## 2020-06-27 ENCOUNTER — Other Ambulatory Visit: Payer: Self-pay

## 2020-06-27 ENCOUNTER — Ambulatory Visit: Payer: 59 | Admitting: Family Medicine

## 2020-06-27 ENCOUNTER — Other Ambulatory Visit: Payer: Self-pay | Admitting: Family Medicine

## 2020-06-27 ENCOUNTER — Encounter: Payer: Self-pay | Admitting: Family Medicine

## 2020-06-27 VITALS — BP 110/70 | HR 72 | Temp 97.8°F | Ht 69.0 in | Wt 169.2 lb

## 2020-06-27 DIAGNOSIS — E7800 Pure hypercholesterolemia, unspecified: Secondary | ICD-10-CM | POA: Diagnosis not present

## 2020-06-27 DIAGNOSIS — I252 Old myocardial infarction: Secondary | ICD-10-CM | POA: Diagnosis not present

## 2020-06-27 DIAGNOSIS — E559 Vitamin D deficiency, unspecified: Secondary | ICD-10-CM | POA: Insufficient documentation

## 2020-06-27 DIAGNOSIS — I10 Essential (primary) hypertension: Secondary | ICD-10-CM | POA: Diagnosis not present

## 2020-06-27 DIAGNOSIS — Z131 Encounter for screening for diabetes mellitus: Secondary | ICD-10-CM

## 2020-06-27 DIAGNOSIS — D72819 Decreased white blood cell count, unspecified: Secondary | ICD-10-CM

## 2020-06-27 DIAGNOSIS — I2510 Atherosclerotic heart disease of native coronary artery without angina pectoris: Secondary | ICD-10-CM | POA: Insufficient documentation

## 2020-06-27 DIAGNOSIS — M5136 Other intervertebral disc degeneration, lumbar region: Secondary | ICD-10-CM | POA: Insufficient documentation

## 2020-06-27 DIAGNOSIS — Z959 Presence of cardiac and vascular implant and graft, unspecified: Secondary | ICD-10-CM

## 2020-06-27 DIAGNOSIS — M51369 Other intervertebral disc degeneration, lumbar region without mention of lumbar back pain or lower extremity pain: Secondary | ICD-10-CM | POA: Insufficient documentation

## 2020-06-27 LAB — COMPREHENSIVE METABOLIC PANEL
ALT: 23 U/L (ref 0–53)
AST: 23 U/L (ref 0–37)
Albumin: 4.8 g/dL (ref 3.5–5.2)
Alkaline Phosphatase: 58 U/L (ref 39–117)
BUN: 13 mg/dL (ref 6–23)
CO2: 28 mEq/L (ref 19–32)
Calcium: 9.6 mg/dL (ref 8.4–10.5)
Chloride: 98 mEq/L (ref 96–112)
Creatinine, Ser: 0.91 mg/dL (ref 0.40–1.50)
GFR: 84.98 mL/min (ref >60.00)
Glucose, Bld: 87 mg/dL (ref 70–99)
Potassium: 4.8 mEq/L (ref 3.5–5.1)
Sodium: 133 mEq/L — ABNORMAL LOW (ref 135–145)
Total Bilirubin: 0.9 mg/dL (ref 0.2–1.2)
Total Protein: 7.2 g/dL (ref 6.0–8.3)

## 2020-06-27 LAB — CBC WITH DIFFERENTIAL/PLATELET
Basophils Absolute: 0 10*3/uL (ref 0.0–0.1)
Basophils Relative: 0.6 % (ref 0.0–3.0)
Eosinophils Absolute: 0 10*3/uL (ref 0.0–0.7)
Eosinophils Relative: 0.4 % (ref 0.0–5.0)
HCT: 43.9 % (ref 39.0–52.0)
Hemoglobin: 15.4 g/dL (ref 13.0–17.0)
Lymphocytes Relative: 17.8 % (ref 12.0–46.0)
Lymphs Abs: 0.8 10*3/uL (ref 0.7–4.0)
MCHC: 35.2 g/dL (ref 30.0–36.0)
MCV: 95.5 fl (ref 78.0–100.0)
Monocytes Absolute: 0.5 10*3/uL (ref 0.1–1.0)
Monocytes Relative: 12.5 % — ABNORMAL HIGH (ref 3.0–12.0)
Neutro Abs: 2.9 10*3/uL (ref 1.4–7.7)
Neutrophils Relative %: 68.7 % (ref 43.0–77.0)
Platelets: 180 10*3/uL (ref 150.0–400.0)
RBC: 4.6 Mil/uL (ref 4.22–5.81)
RDW: 13.1 % (ref 11.5–15.5)
WBC: 4.3 10*3/uL (ref 4.0–10.5)

## 2020-06-27 LAB — LIPID PANEL
Cholesterol: 88 mg/dL (ref 0–200)
HDL: 38.5 mg/dL — ABNORMAL LOW (ref >39.00)
LDL Cholesterol: 39 mg/dL (ref 0–99)
NonHDL: 49.51
Total CHOL/HDL Ratio: 2
Triglycerides: 51 mg/dL (ref 0.0–149.0)
VLDL: 10.2 mg/dL (ref 0.0–40.0)

## 2020-06-27 LAB — HEMOGLOBIN A1C: Hgb A1c MFr Bld: 5.1 % (ref 4.6–6.5)

## 2020-06-27 MED ORDER — AZELASTINE HCL 0.1 % NA SOLN
1.0000 | Freq: Two times a day (BID) | NASAL | 1 refills | Status: DC
Start: 2020-06-27 — End: 2021-01-07

## 2020-06-27 MED ORDER — ROSUVASTATIN CALCIUM 40 MG PO TABS
40.0000 mg | ORAL_TABLET | Freq: Every day | ORAL | 3 refills | Status: DC
Start: 2020-06-27 — End: 2020-06-27

## 2020-06-27 MED ORDER — FINASTERIDE 1 MG PO TABS
1.0000 mg | ORAL_TABLET | Freq: Every day | ORAL | 3 refills | Status: DC
Start: 2020-06-27 — End: 2021-01-21

## 2020-06-27 MED ORDER — FLUTICASONE PROPIONATE 50 MCG/ACT NA SUSP
2.0000 | Freq: Every day | NASAL | 3 refills | Status: DC
Start: 2020-06-27 — End: 2021-01-07

## 2020-06-27 MED ORDER — LISINOPRIL 20 MG PO TABS
20.0000 mg | ORAL_TABLET | Freq: Every day | ORAL | 3 refills | Status: DC
Start: 2020-06-27 — End: 2020-06-27

## 2020-06-27 MED ORDER — CLOPIDOGREL BISULFATE 75 MG PO TABS
75.0000 mg | ORAL_TABLET | Freq: Every day | ORAL | 3 refills | Status: DC
Start: 2020-06-27 — End: 2020-06-27

## 2020-06-27 MED ORDER — METOPROLOL SUCCINATE ER 100 MG PO TB24
100.0000 mg | ORAL_TABLET | Freq: Every day | ORAL | 3 refills | Status: DC
Start: 2020-06-27 — End: 2021-01-21

## 2020-06-27 NOTE — Patient Instructions (Signed)
#  Sleep - consider ear plug - discuss with your wife ways to decrease morning interruptions - or consider switching your sleep pattern  #Depression  - hand out for number for therapy - would recommend starting with therapy  Continue current medications

## 2020-06-27 NOTE — Assessment & Plan Note (Signed)
Noted on history, no recent labs. Will recheck

## 2020-06-27 NOTE — Progress Notes (Signed)
Subjective:     Luke Glenn is a 59 y.o. male presenting for Establish Care (sleeping issues, depression (according to wife))     HPI  #Sleep Issues - has chronic back pain - pain is one thing keeping in him - wife gets up to work early in the morning - endorses getting around 5 hours - difficulty getting comfortable at night - no issues falling asleep -    #S/p medicated stents - continues to take asa and plavix - most recent 2008 - last time he saw cardiology was 2 years ago - no issues with bleeding  Review of Systems   Social History   Tobacco Use  Smoking Status Former Smoker  . Packs/day: 0.50  . Years: 20.00  . Pack years: 10.00  . Types: Cigars, Cigarettes  . Quit date: 2002  . Years since quitting: 19.6  Smokeless Tobacco Never Used        Objective:    BP Readings from Last 3 Encounters:  06/27/20 110/70  07/19/18 (!) 103/53  12/25/15 111/79   Wt Readings from Last 3 Encounters:  06/27/20 169 lb 4 oz (76.8 kg)  07/19/18 179 lb 8 oz (81.4 kg)  12/25/15 179 lb (81.2 kg)    BP 110/70   Pulse 72   Temp 97.8 F (36.6 C) (Temporal)   Ht 5\' 9"  (1.753 m)   Wt 169 lb 4 oz (76.8 kg)   SpO2 97%   BMI 24.99 kg/m    Physical Exam Constitutional:      Appearance: Normal appearance. He is not ill-appearing or diaphoretic.  HENT:     Right Ear: External ear normal.     Left Ear: External ear normal.     Nose: Nose normal.  Eyes:     General: No scleral icterus.    Extraocular Movements: Extraocular movements intact.     Conjunctiva/sclera: Conjunctivae normal.  Cardiovascular:     Rate and Rhythm: Normal rate.  Pulmonary:     Effort: Pulmonary effort is normal.  Musculoskeletal:     Cervical back: Neck supple.  Skin:    General: Skin is warm and dry.  Neurological:     Mental Status: He is alert. Mental status is at baseline.  Psychiatric:        Mood and Affect: Mood normal.        Office Visit from 06/27/2020 in [place] at [facility]  PHQ-9 Total Score 6          Assessment & Plan:   Problem List Items Addressed This Visit      Cardiovascular and Mediastinum   Essential hypertension    BP controlled. Continue lisinopril and metoprolol      Relevant Medications   rosuvastatin (CRESTOR) 40 MG tablet   metoprolol succinate (TOPROL-XL) 100 MG 24 hr tablet   lisinopril (ZESTRIL) 20 MG tablet   Other Relevant Orders   Comprehensive metabolic panel     Other   Leukopenia    Noted on history, no recent labs. Will recheck       Relevant Orders   CBC with Differential   Pure hypercholesterolemia    Repeat labs. Cont statin.       Relevant Medications   rosuvastatin (CRESTOR) 40 MG tablet   metoprolol succinate (TOPROL-XL) 100 MG 24 hr tablet   lisinopril (ZESTRIL) 20 MG tablet   Other Relevant Orders   Lipid panel   S/P arterial stent    Reviewed last cardiology  note - he was doing well. No comment on duration of dual anti-platelet but per report has been on for years. No bleeding will continue and reconsider dual agent in the future.       History of MI (myocardial infarction)   Relevant Orders   Comprehensive metabolic panel   Vitamin D deficiency    Discussed continuing supplement. No need for repeat labs.        Other Visit Diagnoses    Screening for diabetes mellitus    -  Primary   Relevant Orders   Hemoglobin A1c       Return in about 6 months (around 12/28/2020) for annual wellness visit.  Lesleigh Noe, MD  This visit occurred during the SARS-CoV-2 public health emergency.  Safety protocols were in place, including screening questions prior to the visit, additional usage of staff PPE, and extensive cleaning of exam room while observing appropriate contact time as indicated for disinfecting solutions.

## 2020-06-27 NOTE — Assessment & Plan Note (Signed)
Discussed continuing supplement. No need for repeat labs.

## 2020-06-27 NOTE — Assessment & Plan Note (Signed)
Repeat labs. Cont statin.

## 2020-06-27 NOTE — Assessment & Plan Note (Signed)
Reviewed last cardiology note - he was doing well. No comment on duration of dual anti-platelet but per report has been on for years. No bleeding will continue and reconsider dual agent in the future.

## 2020-06-27 NOTE — Assessment & Plan Note (Signed)
BP controlled. Continue lisinopril and metoprolol

## 2020-07-02 ENCOUNTER — Telehealth: Payer: Self-pay

## 2020-07-02 DIAGNOSIS — G4700 Insomnia, unspecified: Secondary | ICD-10-CM

## 2020-07-02 MED ORDER — HYDROXYZINE HCL 10 MG PO TABS: 10.0000 mg | ORAL_TABLET | Freq: Every evening | ORAL | 0 refills | Status: DC

## 2020-07-02 NOTE — Telephone Encounter (Signed)
Notified pt of information, per Dr. Einar Pheasant.

## 2020-07-02 NOTE — Telephone Encounter (Signed)
Notified pt of information from Dr. Einar Pheasant. Canceled Rx that was sent into [facility] in error.

## 2020-07-02 NOTE — Telephone Encounter (Signed)
Per chart review tab pt just established care with Dr Einar Pheasant on 06/27/20.

## 2020-07-02 NOTE — Telephone Encounter (Signed)
[place] Night - Client TELEPHONE ADVICE RECORD [facility] Patient Name: Luke Glenn Gender: Male DOB: [birth year] Age: 59 Y 12 D Return Phone Number: [phone] (Primary) Address: City/State/Zip: [place] [address] Client [facility] Night - Client Client Site [place] Physician Waunita Schooner- MD Contact Type Call Who Is Calling Patient / Member / Family / Caregiver Call Type Triage / Clinical Relationship To Patient Self Return Phone Number [phone] (Primary) Chief Complaint Health information question (non symptomatic) Reason for Call Symptomatic / Request for [place] states he is calling about a tetanus shot information, he is on heart medication and is wondering if it would be okay for him to get one. Translation No Nurse Assessment Nurse: Waymon Budge, RN, Vaughan Basta Date/Time (Eastern Time): 07/01/2020 6:55:49 PM Confirm and document reason for call. If symptomatic, describe symptoms. ---Caller states he is calling about a tetanus shot information. States he works around [employer] , also takes Plavix and aspirin so he bleeds if he scratches himself. Says he just wants to schedule a shot and wants to know about cost. Has the patient had close contact with a person known or suspected to have the novel coronavirus illness OR traveled / lives in area with major community spread (including international travel) in the last 14 days from the onset of symptoms? * If Asymptomatic, screen for exposure and travel within the last 14 days. ---No Does the patient have any new or worsening symptoms? ---No Please document clinical information provided and list any resource used. ---Advised caller to call the office in the morning. Disp. Time Eilene Ghazi Time) Disposition Final User 07/01/2020 6:59:05 PM Clinical Call Yes Waymon Budge, RN, Vaughan Basta

## 2020-07-02 NOTE — Telephone Encounter (Signed)
Insurance may not cover an early TD but safe to get early.   Medication sent to pharmacy.   Please call [facility] and cancel as sent there originally.

## 2020-07-02 NOTE — Addendum Note (Signed)
Addended by: Waunita Schooner R on: 07/02/2020 01:09 PM   Modules accepted: Orders

## 2020-07-02 NOTE — Telephone Encounter (Signed)
Please notify patient it is safe for him to get a tetanus shot.   Though based on our information he is not due until 2025

## 2020-07-02 NOTE — Telephone Encounter (Signed)
Patient left a voicemail stating that he talked with Alyse Low and would like to get his tetanus vaccine because he works around [employer]. Patient stated that he bleeds a lot because of being on Plavix and an aspirin. Patient stated that he also would like a script for Hydroxyzine 10 mg. Patient stated that his mother was given the medication and his sister did not want her to take it. Patient stated that he has been taking it and it helps him to sleep.  Patient stated that he has tried the herbal stuff and it does not help. Pharmacy  [facility]

## 2020-08-02 ENCOUNTER — Ambulatory Visit: Payer: 59 | Attending: Internal Medicine

## 2020-08-02 DIAGNOSIS — Z23 Encounter for immunization: Secondary | ICD-10-CM

## 2020-08-02 NOTE — Progress Notes (Signed)
   Covid-19 Vaccination Clinic  Name:  NICKEY KLOEPFER    MRN: [id number] DOB: [birth year]  08/02/2020  Mr. Buckwalter was observed post Covid-19 immunization for 15 minutes without incident. He was provided with Vaccine Information Sheet and instruction to access the V-Safe system.   Mr. Vanbenschoten was instructed to call 911 with any severe reactions post vaccine: Marland Kitchen Difficulty breathing  . Swelling of face and throat  . A fast heartbeat  . A bad rash all over body  . Dizziness and weakness

## 2020-09-12 ENCOUNTER — Other Ambulatory Visit: Payer: Self-pay | Admitting: Physical Medicine and Rehabilitation

## 2020-09-30 ENCOUNTER — Encounter: Payer: Self-pay | Admitting: Family Medicine

## 2020-09-30 ENCOUNTER — Telehealth: Payer: Self-pay | Admitting: Family Medicine

## 2020-09-30 NOTE — Telephone Encounter (Signed)
Best Phone # [phone]  [facility] in [place].

## 2020-09-30 NOTE — Telephone Encounter (Signed)
We have never discussed a history of cold sores or prior use of this medication.  He needs an appointment to discuss.   If he does not want to have an appointment he could try OTC Abreva (Docosanol 10%) cream

## 2020-09-30 NOTE — Telephone Encounter (Signed)
Patient would like some cold sore medicine. He refused to come in to be seen states that he has never been prescribed. Offered appt and it was declined. The name of the medicine valacycolovir 500 mg for a cold sore.  EM

## 2020-12-31 ENCOUNTER — Encounter: Payer: 59 | Admitting: Family Medicine

## 2021-01-06 ENCOUNTER — Other Ambulatory Visit: Payer: Self-pay | Admitting: Family Medicine

## 2021-01-07 ENCOUNTER — Other Ambulatory Visit: Payer: Self-pay | Admitting: Family Medicine

## 2021-01-07 NOTE — Telephone Encounter (Signed)
Pharmacy requests refill on: Fluticasone 50 mcg/act nasal spray & azelastine 0.1% nasal spray   LAST REFILL: 06/27/2020 LAST OV: 06/27/2020 NEXT OV: 01/21/2021 PHARMACY: [facility]

## 2021-01-21 ENCOUNTER — Other Ambulatory Visit: Payer: Self-pay

## 2021-01-21 ENCOUNTER — Encounter: Payer: Self-pay | Admitting: Family Medicine

## 2021-01-21 ENCOUNTER — Ambulatory Visit (INDEPENDENT_AMBULATORY_CARE_PROVIDER_SITE_OTHER): Payer: 59 | Admitting: Family Medicine

## 2021-01-21 ENCOUNTER — Other Ambulatory Visit: Payer: Self-pay | Admitting: Family Medicine

## 2021-01-21 VITALS — BP 92/58 | HR 73 | Temp 97.0°F | Ht 69.0 in | Wt 171.8 lb

## 2021-01-21 DIAGNOSIS — I10 Essential (primary) hypertension: Secondary | ICD-10-CM | POA: Diagnosis not present

## 2021-01-21 DIAGNOSIS — N401 Enlarged prostate with lower urinary tract symptoms: Secondary | ICD-10-CM | POA: Insufficient documentation

## 2021-01-21 DIAGNOSIS — R351 Nocturia: Secondary | ICD-10-CM

## 2021-01-21 DIAGNOSIS — Z0000 Encounter for general adult medical examination without abnormal findings: Secondary | ICD-10-CM | POA: Diagnosis not present

## 2021-01-21 MED ORDER — FINASTERIDE 5 MG PO TABS: 5.0000 mg | ORAL_TABLET | Freq: Every day | ORAL | 0 refills | Status: DC

## 2021-01-21 NOTE — Assessment & Plan Note (Signed)
BP low. Take 1/2 lisinopril (10 mg) cont metoprolol 25 mg

## 2021-01-21 NOTE — Progress Notes (Signed)
Annual Exam   Chief Complaint:  Chief Complaint  Patient presents with  . Annual Exam  . Insomnia    Trouble staying asleep. Wakes 2-3 times a night.     History of Present Illness:  Luke Glenn is a 60 y.o. presents today for annual examination.    #Nighttime wakening - in career transition - some stress - wakes up and does pee - but not sure if this why he is waking up - no difficulty emptying the bladder or starting a stream - some difficulty starting a stream  Nutrition/Lifestyle Diet: intermittent fasting occasionally, clementines in the evening, caffeine - trying to cut back, mostly diet coke - 5 cans of soda per day stopping around 3 pm Exercise: was walking with wife, but now some pain He is single partner, contraception - post menopausal status.  Any issues with getting or keeping erection? No  Social History   Tobacco Use  Smoking Status Former Smoker  . Packs/day: 0.50  . Years: 20.00  . Pack years: 10.00  . Types: Cigars, Cigarettes  . Quit date: 2002  . Years since quitting: 20.2  Smokeless Tobacco Never Used   Social History   Substance and Sexual Activity  Alcohol Use Yes   Comment: 1-2 beers less than 3 times a week   Social History   Substance and Sexual Activity  Drug Use No     Safety The patient wears seatbelts: yes.     The patient feels safe at home and in their relationships: yes.  General Health Dentist in the last year: Yes Eye doctor: yes  Weight Wt Readings from Last 3 Encounters:  01/21/21 171 lb 12 oz (77.9 kg)  06/27/20 169 lb 4 oz (76.8 kg)  07/19/18 179 lb 8 oz (81.4 kg)   Patient has normal BMI  BMI Readings from Last 1 Encounters:  01/21/21 25.36 kg/m     Chronic disease screening Blood pressure monitoring:  BP Readings from Last 3 Encounters:  01/21/21 (!) 92/58  06/27/20 110/70  07/19/18 (!) 103/53    Lipid Monitoring: Indication for screening: age >35, obesity, diabetes, family hx, CV risk factors.   Lipid screening: Yes  Lab Results  Component Value Date   CHOL 88 06/27/2020   HDL 38.50 (L) 06/27/2020   LDLCALC 39 06/27/2020   TRIG 51.0 06/27/2020   CHOLHDL 2 06/27/2020     Diabetes Screening: age >28, overweight, family hx, PCOS, hx of gestational diabetes, at risk ethnicity, elevated blood pressure >135/80.  Diabetes Screening screening: Yes  Lab Results  Component Value Date   HGBA1C 5.1 06/27/2020     Prostate Cancer Screening: Yes Age 64-61 yo Shared Decision Making Higher Risk: Older age, African American, Family Hx of Prostate Cancer - No Benefits: screening may prevent 1.3 deaths from prostate cancer over 13 years per 1000 men screened and prevent 3 metastatic cases per 1000 men screened. Not enough evidence to support more benefit for AA or [place] Harms: False Positive and psychological harms. 15% of men with false positive over a 2 to 4 year period > resulting in biopsy and complications such as pain, hematospermia, infections. Overdiagnosis - increases with age - found that 20-50% of prostate cancer through screening may have never caused any issues. Harms of treatment include - erectile dysfunction, urinary incontinence, and bothersome bowel symptoms.   After discussion he does want to get a PSA checked today.   Inadequate evidence for screening <55 No mortality benefit for screening >70  No results found for: PSA1, PSA     Colon Cancer Screening:  Age 6-75 yo - benefits outweigh the risk. Adults 7-85 yo who have never been screened benefit.  Benefits: 134000 people in 2016 will be diagnosed and 49,000 will die - early detection helps Harms: Complications 2/2 to colonoscopy High Risk (Colonoscopy): genetic disorder (Lynch syndrome or familial adenomatous polyposis), personal hx of IBD, previous adenomatous polyp, or previous colorectal cancer, FamHx start 10 years before the age at diagnosis, increased in males and black race  Options:  FIT - looks for  hemoglobin (blood in the stool) - specific and fairly sensitive - must be done annually Cologuard - looks for DNA and blood - more sensitive - therefore can have more false positives, every 3 years Colonoscopy - every 10 years if normal - sedation, bowl prep, must have someone drive you  Shared decision making and the patient had decided to do colonscopy and is potentially due this year.   Social History   Tobacco Use  Smoking Status Former Smoker  . Packs/day: 0.50  . Years: 20.00  . Pack years: 10.00  . Types: Cigars, Cigarettes  . Quit date: 2002  . Years since quitting: 20.2  Smokeless Tobacco Never Used    Lung Cancer Screening (Ages 72-53): not applicable 20 year pack history? No Current Tobacco user? No Quit less than 15 years ago? No Interested in low dose CT for lung cancer screening? not applicable  Abdominal Aortic Aneurysm:  Age 83-75, 1 time screening, men who have ever smoked not due yet    Past Medical History:  Diagnosis Date  . Coronary artery disease   . DDD (degenerative disc disease), lumbosacral   . Elevated lipids   . Hypertension   . Myocardial infarction [facility])     Past Surgical History:  Procedure Laterality Date  . ANGIOPLASTY / STENTING ILIAC    . COLONOSCOPY WITH ESOPHAGOGASTRODUODENOSCOPY (EGD)    . COLONOSCOPY WITH PROPOFOL N/A 12/25/2015   Procedure: COLONOSCOPY WITH PROPOFOL;  Surgeon: Manya Silvas, MD;  Location: [facility] ENDOSCOPY;  Service: Endoscopy;  Laterality: N/A;    Prior to Admission medications   Medication Sig Start Date End Date Taking? Authorizing Provider  aspirin 81 MG tablet 81 mg daily.    [provider]  azelastine (ASTELIN) 0.1 % nasal spray PLACE 1 SPRAY INTO BOTH NOSTRILS 2 TIMES DAILY AS DIRECTED 01/07/21   Lesleigh Noe, MD  calcium-vitamin D (OSCAL WITH D) 500-200 MG-UNIT tablet Take 1 tablet by mouth.    [provider]  cetirizine (ZYRTEC) 10 MG tablet Take 10 mg by mouth daily.     [provider]  clopidogrel (PLAVIX) 75 MG tablet Take 1 tablet (75 mg total) by mouth daily. 06/27/20   Lesleigh Noe, MD  finasteride (PROPECIA) 1 MG tablet Take 1 tablet (1 mg total) by mouth daily. 06/27/20   Lesleigh Noe, MD  fluticasone (FLONASE) 50 MCG/ACT nasal spray PLACE 2 SPRAYS INTO BOTH NOSTRILS DAILY. 01/07/21   Lesleigh Noe, MD  gabapentin (NEURONTIN) 300 MG capsule 1 po qHS x 4 days, then bid 05/27/20   [provider]  hydrOXYzine (ATARAX/VISTARIL) 10 MG tablet Take 1 tablet (10 mg total) by mouth at bedtime. 07/02/20   Lesleigh Noe, MD  lisinopril (ZESTRIL) 20 MG tablet Take 1 tablet (20 mg total) by mouth daily. 06/27/20   Lesleigh Noe, MD  magnesium oxide (MAG-OX) 400 MG tablet Take by mouth.  [provider]  metoprolol succinate (TOPROL-XL) 100 MG 24 hr tablet Take 1 tablet (100 mg total) by mouth daily. Take with or immediately following a meal. 06/27/20   Lesleigh Noe, MD  Multiple Vitamin (MULTIVITAMIN) capsule Take 1 capsule by mouth daily.    [provider]  niacin 500 MG tablet Take 1,500 mg by mouth 1 day or 1 dose. 3 tablets daily in AM    [provider]  omega-3 acid ethyl esters (LOVAZA) 1 g capsule Take 3 g by mouth daily.     [provider]  rosuvastatin (CRESTOR) 40 MG tablet Take 1 tablet (40 mg total) by mouth daily. 06/27/20   Lesleigh Noe, MD    No Known Allergies   Social History   Socioeconomic History  . Marital status: Married    Spouse name: Joelene Millin   . Number of children: 2  . Years of education: associates degree  . Highest education level: Not on file  Occupational History  . Not on file  Tobacco Use  . Smoking status: Former Smoker    Packs/day: 0.50    Years: 20.00    Pack years: 10.00    Types: Cigars, Cigarettes    Quit date: 2002    Years since quitting: 20.2  . Smokeless tobacco: Never Used  Vaping Use  . Vaping Use: Never used  Substance and Sexual  Activity  . Alcohol use: Yes    Comment: 1-2 beers less than 3 times a week  . Drug use: No  . Sexual activity: Yes    Birth control/protection: Post-menopausal  Other Topics Concern  . 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      Family: 2 sons - Health visitor (married with 2 daughters) and Truddie Coco (Manufacturing systems engineer)      Enjoys: working on cars,       Exercise: not currently 2/2 to back pain, home treatment of back pain   Diet: intermittent fasting diet, high protein diet      Safety   Seat belts: Yes    Guns: Yes  and secure   Safe in relationships: Yes    Social Determinants of Radio broadcast assistant Strain: Not on file  Food Insecurity: Not on file  Transportation Needs: Not on file  Physical Activity: Not on file  Stress: Not on file  Social Connections: Not on file  Intimate Partner Violence: Not on file    Family History  Problem Relation Age of Onset  . Dementia Mother   . Diabetes Father   . Heart attack Father   . Bladder Cancer Father   . Stroke Maternal Grandmother   . Heart attack Maternal Grandmother   . Cancer Maternal Grandfather        unknown type  . Gout Maternal Grandfather   . Diabetes Paternal Grandmother   . Heart attack Paternal Grandfather   . Kidney cancer Neg Hx   . Prostate cancer Neg Hx     Review of Systems  Constitutional: Negative for chills and fever.  HENT: Negative for congestion and sore throat.   Eyes: Negative for blurred vision and double vision.  Respiratory: Negative for shortness of breath.   Cardiovascular: Negative for chest pain.  Gastrointestinal: Negative for heartburn, nausea and vomiting.  Genitourinary: Negative.  Musculoskeletal: Positive for back pain. Negative for myalgias.  Skin: Negative for rash.  Neurological: Negative for dizziness and headaches.   Endo/Heme/Allergies: Does not bruise/bleed easily.  Psychiatric/Behavioral: Negative for depression. The patient has insomnia. The patient is not nervous/anxious.      Physical Exam BP (!) 92/58   Pulse 73   Temp (!) 97 F (36.1 C) (Temporal)   Ht 5\' 9"  (1.753 m)   Wt 171 lb 12 oz (77.9 kg)   SpO2 97%   BMI 25.36 kg/m    BP Readings from Last 3 Encounters:  01/21/21 (!) 92/58  06/27/20 110/70  07/19/18 (!) 103/53      Physical Exam Constitutional:      General: He is not in acute distress.    Appearance: He is well-developed. He is not diaphoretic.  HENT:     Head: Normocephalic and atraumatic.     Right Ear: Tympanic membrane and ear canal normal.     Left Ear: Tympanic membrane and ear canal normal.     Nose: Nose normal.     Mouth/Throat:     Mouth: Mucous membranes are moist.     Pharynx: Uvula midline.  Eyes:     General: No scleral icterus.    Conjunctiva/sclera: Conjunctivae normal.     Pupils: Pupils are equal, round, and reactive to light.  Cardiovascular:     Rate and Rhythm: Normal rate and regular rhythm.     Heart sounds: Normal heart sounds. No murmur heard.   Pulmonary:     Effort: Pulmonary effort is normal. No respiratory distress.     Breath sounds: Normal breath sounds. No wheezing.  Abdominal:     General: Bowel sounds are normal. There is no distension.     Palpations: Abdomen is soft. There is no mass.     Tenderness: There is no abdominal tenderness. There is no guarding.  Musculoskeletal:        General: Normal range of motion.     Cervical back: Normal range of motion and neck supple.  Lymphadenopathy:     Cervical: No cervical adenopathy.  Skin:    General: Skin is warm and dry.     Capillary Refill: Capillary refill takes less than 2 seconds.  Neurological:     Mental Status: He is alert and oriented to person, place, and time.        Results:  PHQ-9:  [facility] Office Visit from 01/21/2021 in [place] at  [place]  PHQ-9 Total Score 5        Assessment: 60 y.o. here for routine annual physical examination.  Plan: Problem List Items Addressed This Visit      Cardiovascular and Mediastinum   Essential hypertension    BP low. Take 1/2 lisinopril (10 mg) cont metoprolol 25 mg      Relevant Medications   metoprolol succinate (TOPROL-XL) 25 MG 24 hr tablet     Other   BPH associated with nocturia    Suspect this is the cause of his insomnia. Increase finasteride 1 mg > 5 mg.       Relevant Medications   finasteride (PROSCAR) 5 MG tablet    Other Visit Diagnoses    Annual physical exam    -  Primary      Screening: -- Blood pressure screen normal -- cholesterol screening: not due for screening -- Weight screening: normal -- Diabetes Screening: not due for screening -- Nutrition: normal - Encouraged healthy diet  The ASCVD  Risk score Mikey Bussing DC Jr., et al., 2013) failed to calculate for the following reasons:   The patient has a prior MI or stroke diagnosis  -- ASA 81 mg discussed if CVD risk >10% age 32-59 and willing to take for 10 years -- Statin therapy for Age 66-75 with CVD risk >7.5%  Psych -- Depression screening (PHQ-9): positive - will continue to monitor Safety -- tobacco screening: not using -- alcohol screening:  low-risk usage. -- no evidence of domestic violence or intimate partner violence.  Cancer Screening -- Prostate (age 98-69) will plan with next lab draw -- Colon (age 34-75) he will call as may be due for 5 year f/u -- Lung not indicated   Immunizations Immunization History  Administered Date(s) Administered  . Influenza,inj,Quad PF,6+ Mos 09/17/2015, 08/10/2018, 08/18/2020  . Influenza-Unspecified 09/11/2013, 09/01/2016, 09/09/2017  . PFIZER(Purple Top)SARS-COV-2 Vaccination 08/02/2020  . Pneumococcal Polysaccharide-23 09/24/2015  . Td 08/13/2008  . Tdap 05/30/2014  . Zoster 08/09/2016    -- flu vaccine up to date -- TDAP q10  years up to date -- Shingles (age >45) unknown, record requested -- PPSV-23 (19-64 with chronic disease or smoking) up to date -- Covid-19 Vaccine up to date  Encouraged regular vision and dental screening. Encouraged healthy exercise and diet.   Lesleigh Noe

## 2021-01-21 NOTE — Patient Instructions (Addendum)
Call [facility] for GI - and see if they think you are due - if you need a referral call back   #Blood pressure - take 1/2 lisinopril - check blood pressure at home - if elevated >140/90 return to normal dosing  #Sleep - try higher dose of the Finasteride - work to decrease caffeine

## 2021-01-21 NOTE — Assessment & Plan Note (Signed)
Suspect this is the cause of his insomnia. Increase finasteride 1 mg > 5 mg.

## 2021-03-03 ENCOUNTER — Other Ambulatory Visit: Payer: Self-pay

## 2021-03-03 MED ORDER — PREGABALIN 75 MG PO CAPS
ORAL_CAPSULE | ORAL | 5 refills | Status: DC
  Filled 2021-03-03: qty 60, 30d supply, fill #0
  Filled 2021-04-10: qty 180, 90d supply, fill #1
  Filled 2021-07-04: qty 120, 60d supply, fill #2

## 2021-03-10 ENCOUNTER — Other Ambulatory Visit: Payer: Self-pay

## 2021-04-10 ENCOUNTER — Other Ambulatory Visit: Payer: Self-pay

## 2021-04-11 ENCOUNTER — Other Ambulatory Visit: Payer: Self-pay

## 2021-04-11 MED FILL — Clopidogrel Bisulfate Tab 75 MG (Base Equiv): ORAL | 90 days supply | Qty: 90 | Fill #0 | Status: AC

## 2021-04-11 MED FILL — Rosuvastatin Calcium Tab 40 MG: ORAL | 90 days supply | Qty: 90 | Fill #0 | Status: AC

## 2021-04-11 MED FILL — Lisinopril Tab 20 MG: ORAL | 90 days supply | Qty: 90 | Fill #0 | Status: AC

## 2021-04-17 ENCOUNTER — Other Ambulatory Visit: Payer: Self-pay | Admitting: Physical Medicine and Rehabilitation

## 2021-04-17 DIAGNOSIS — M5416 Radiculopathy, lumbar region: Secondary | ICD-10-CM

## 2021-04-18 ENCOUNTER — Ambulatory Visit: Payer: 59 | Attending: Internal Medicine

## 2021-04-18 ENCOUNTER — Other Ambulatory Visit: Payer: Self-pay

## 2021-04-18 DIAGNOSIS — Z23 Encounter for immunization: Secondary | ICD-10-CM

## 2021-04-18 MED ORDER — MODERNA COVID-19 VACCINE 100 MCG/0.5ML IM SUSP
INTRAMUSCULAR | 0 refills | Status: DC
  Filled 2021-04-18: qty 0.25, 1d supply, fill #0

## 2021-04-18 NOTE — Progress Notes (Signed)
   Covid-19 Vaccination Clinic  Name:  STANLY SI    MRN: [id number] DOB: 01-09-1961  04/18/2021  Mr. Sedivy was observed post Covid-19 immunization for 15 minutes without incident. He was provided with Vaccine Information Sheet and instruction to access the V-Safe system.   Mr. Brahm was instructed to call 911 with any severe reactions post vaccine: Difficulty breathing  Swelling of face and throat  A fast heartbeat  A bad rash all over body  Dizziness and weakness   Immunizations Administered     Name Date Dose VIS Date Route   Moderna Covid-19 Booster Vaccine 04/18/2021  9:28 AM 0.25 mL 08/28/2020 Intramuscular   Manufacturer: Levan Hurst   Lot: [id number]   [place]: [id number]      Lu Duffel, PharmD, MBA Clinical Acute Care Pharmacist

## 2021-04-24 ENCOUNTER — Ambulatory Visit (INDEPENDENT_AMBULATORY_CARE_PROVIDER_SITE_OTHER): Payer: 59 | Admitting: Family Medicine

## 2021-04-24 ENCOUNTER — Other Ambulatory Visit: Payer: Self-pay

## 2021-04-24 ENCOUNTER — Other Ambulatory Visit: Payer: Self-pay | Admitting: Family Medicine

## 2021-04-24 VITALS — BP 120/58 | HR 61 | Temp 97.6°F | Ht 69.0 in | Wt 173.5 lb

## 2021-04-24 DIAGNOSIS — N401 Enlarged prostate with lower urinary tract symptoms: Secondary | ICD-10-CM

## 2021-04-24 DIAGNOSIS — I10 Essential (primary) hypertension: Secondary | ICD-10-CM

## 2021-04-24 DIAGNOSIS — E7800 Pure hypercholesterolemia, unspecified: Secondary | ICD-10-CM | POA: Diagnosis not present

## 2021-04-24 DIAGNOSIS — R351 Nocturia: Secondary | ICD-10-CM

## 2021-04-24 DIAGNOSIS — I2510 Atherosclerotic heart disease of native coronary artery without angina pectoris: Secondary | ICD-10-CM

## 2021-04-24 LAB — COMPREHENSIVE METABOLIC PANEL
ALT: 24 U/L (ref 0–53)
AST: 24 U/L (ref 0–37)
Albumin: 4.6 g/dL (ref 3.5–5.2)
Alkaline Phosphatase: 76 U/L (ref 39–117)
BUN: 13 mg/dL (ref 6–23)
CO2: 28 mEq/L (ref 19–32)
Calcium: 9.5 mg/dL (ref 8.4–10.5)
Chloride: 99 mEq/L (ref 96–112)
Creatinine, Ser: 0.84 mg/dL (ref 0.40–1.50)
GFR: 94.56 mL/min (ref >60.00)
Glucose, Bld: 102 mg/dL — ABNORMAL HIGH (ref 70–99)
Potassium: 5.1 mEq/L (ref 3.5–5.1)
Sodium: 132 mEq/L — ABNORMAL LOW (ref 135–145)
Total Bilirubin: 1.1 mg/dL (ref 0.2–1.2)
Total Protein: 7.1 g/dL (ref 6.0–8.3)

## 2021-04-24 LAB — LIPID PANEL
Cholesterol: 94 mg/dL (ref 0–200)
HDL: 43.6 mg/dL (ref >39.00)
LDL Cholesterol: 41 mg/dL (ref 0–99)
NonHDL: 50.28
Total CHOL/HDL Ratio: 2
Triglycerides: 44 mg/dL (ref 0.0–149.0)
VLDL: 8.8 mg/dL (ref 0.0–40.0)

## 2021-04-24 MED ORDER — LISINOPRIL 20 MG PO TABS
10.0000 mg | ORAL_TABLET | Freq: Every day | ORAL | 3 refills | Status: DC
  Filled 2021-04-24 – 2021-07-04 (×3): qty 45, 90d supply, fill #0
  Filled 2021-10-06: qty 45, 90d supply, fill #1

## 2021-04-24 MED ORDER — FINASTERIDE 5 MG PO TABS
ORAL_TABLET | Freq: Every day | ORAL | 3 refills | Status: DC
  Filled 2021-04-24: qty 90, 90d supply, fill #0
  Filled 2021-07-24: qty 90, 90d supply, fill #1
  Filled 2021-10-29: qty 90, 90d supply, fill #2

## 2021-04-24 MED ORDER — CLOPIDOGREL BISULFATE 75 MG PO TABS
ORAL_TABLET | Freq: Every day | ORAL | 3 refills | Status: DC
  Filled 2021-04-24: qty 90, fill #0
  Filled 2021-07-04: qty 90, 90d supply, fill #0

## 2021-04-24 NOTE — Patient Instructions (Addendum)
Nighttime urination - stop drinking 1 hour before bed - if if you wake up less overnight - if no change in overnight urination, then stop drinking 2 hours before bed and reassess - empty your bladder before getting into bed  Melatonin 3-5 mg -- taken 30-60 minutes    Sleep hygiene checklist: 1. Avoid naps during the day 2. Avoid stimulants such as caffeine and nicotine. Avoid bedtime alcohol (it can speed onset of sleep but the body's metabolism can cause awakenings). At least 2 hours before bedtime 3. All forms of exercise help ensure sound sleep - limit vigorous exercise to morning or late afternoon 4. Avoid food too close to bedtime including chocolate (which contains caffeine) 5. Soak up natural light 6. Establish regular bedtime routine. 7. Associate bed with sleep - avoid TV, computer or phone, reading while in bed. 8. Ensure pleasant, relaxing sleep environment - quiet, dark, cool room.  Good Sleep Hygiene Habits -- Got to bed and wake up within an hour of the same time every day -- Avoid bright screens (from laptop, phone, TV) within at least 30 minutes before bed. The "blue light" supresses the sleep hormone melatonin and the content may stimulate as well -- Maintain a quiet and dark sleep environment (blackout curtains, turn on a fan or white noise to block out disruptive sounds) -- Practicing relaxing activites before bed (taking a shower, reading a book, journaling, meditation app) -- To quiet a busy mind -- consider journaling before bed (jotting down reminders, worry thoughts, as well as positive things like a gratitude list)   Begin a Mindfulness/Meditation practice -- this can take a little as 3 minutes -- You can find resources in books -- Or you can download apps like  ---- Headspace App (which currently has free content called "Weathering the Storm") ---- Calm (which has a few free options)  ---- Insignt Timer ---- Stop, Breathe & Think  # With each of these Apps  - you should decline the "start free trial" offer and as you search through the App should be able to access some of their free content. You can also chose to pay for the content if you find one that works well for you.   # Many of them also offer sleep specific content which may help with insomnia

## 2021-04-24 NOTE — Assessment & Plan Note (Signed)
S/p angioplasty. On metoprolol and asa, niacin and crestor 40 mg. Referral to cardiology as last visit was 3+ years ago to check in.

## 2021-04-24 NOTE — Assessment & Plan Note (Signed)
BP improved. Cont lisinopril 10 mg and metoprolol 25 mg

## 2021-04-24 NOTE — Progress Notes (Signed)
Subjective:     Luke Glenn is a 60 y.o. male presenting for Follow-up (BP and sleep)     HPI  #BPH - doing a new job where he is staying up late at night - drinking lots of water late a night or decaf diet soda - getting up at night to urinate 3 times overnight - no difficulty getting urine out emptying or stopping - goes to bed at midnight   #HTN - no dizziness or lightheadedness - no cp - endorses occasional HA - but also has some back pain  #Back pain - has had a few injections - going to PT - but needed visits before he could get an MRI approved - considering surgery - has some referrals for 3 surgeons   Review of Systems  01/21/2021: Clinic - Insomnia - suspect 2/2 to BPH, increase finasteride 1>5 mg. HTN - low bp - decrease lisinopril   Social History   Tobacco Use  Smoking Status Former   Packs/day: 0.50   Years: 20.00   Pack years: 10.00   Types: Cigars, Cigarettes   Quit date: 2002   Years since quitting: 20.4  Smokeless Tobacco Never        Objective:    BP Readings from Last 3 Encounters:  04/24/21 (!) 120/58  01/21/21 (!) 92/58  06/27/20 110/70   Wt Readings from Last 3 Encounters:  04/24/21 173 lb 8 oz (78.7 kg)  01/21/21 171 lb 12 oz (77.9 kg)  06/27/20 169 lb 4 oz (76.8 kg)    BP (!) 120/58   Pulse 61   Temp 97.6 F (36.4 C) (Temporal)   Ht 5\' 9"  (1.753 m)   Wt 173 lb 8 oz (78.7 kg)   SpO2 96%   BMI 25.62 kg/m    Physical Exam Constitutional:      Appearance: Normal appearance. He is not ill-appearing or diaphoretic.  HENT:     Right Ear: External ear normal.     Left Ear: External ear normal.  Eyes:     General: No scleral icterus.    Extraocular Movements: Extraocular movements intact.     Conjunctiva/sclera: Conjunctivae normal.  Cardiovascular:     Rate and Rhythm: Normal rate and regular rhythm.  Pulmonary:     Effort: Pulmonary effort is normal.     Breath sounds: Normal breath sounds.  Musculoskeletal:      Cervical back: Neck supple.  Skin:    General: Skin is warm and dry.  Neurological:     Mental Status: He is alert. Mental status is at baseline.  Psychiatric:        Mood and Affect: Mood normal.          Assessment & Plan:   Problem List Items Addressed This Visit       Cardiovascular and Mediastinum   CAD (coronary atherosclerotic disease)    S/p angioplasty. On metoprolol and asa, niacin and crestor 40 mg. Referral to cardiology as last visit was 3+ years ago to check in.        Relevant Medications   lisinopril (ZESTRIL) 20 MG tablet   Other Relevant Orders   Ambulatory referral to Cardiology   Essential hypertension - Primary    BP improved. Cont lisinopril 10 mg and metoprolol 25 mg       Relevant Medications   lisinopril (ZESTRIL) 20 MG tablet   Other Relevant Orders   Comprehensive metabolic panel (Completed)   Ambulatory referral to Cardiology  Other   Pure hypercholesterolemia    Lab Results  Component Value Date   CHOL 94 04/24/2021   HDL 43.60 04/24/2021   LDLCALC 41 04/24/2021   TRIG 44.0 04/24/2021   CHOLHDL 2 04/24/2021  Good control. Cont niacin and crestor 40 mg       Relevant Medications   lisinopril (ZESTRIL) 20 MG tablet   Other Relevant Orders   Lipid panel (Completed)   BPH associated with nocturia    Minimal benefit from increase finasteride but declined medication change. Will try to reduce evening drinking and look for improvement.          Return if symptoms worsen or fail to improve.  Lesleigh Noe, MD  This visit occurred during the SARS-CoV-2 public health emergency.  Safety protocols were in place, including screening questions prior to the visit, additional usage of staff PPE, and extensive cleaning of exam room while observing appropriate contact time as indicated for disinfecting solutions.

## 2021-04-24 NOTE — Assessment & Plan Note (Signed)
Lab Results  Component Value Date   CHOL 94 04/24/2021   HDL 43.60 04/24/2021   LDLCALC 41 04/24/2021   TRIG 44.0 04/24/2021   CHOLHDL 2 04/24/2021   Good control. Cont niacin and crestor 40 mg

## 2021-04-24 NOTE — Assessment & Plan Note (Signed)
Minimal benefit from increase finasteride but declined medication change. Will try to reduce evening drinking and look for improvement.

## 2021-04-29 ENCOUNTER — Ambulatory Visit
Admission: RE | Admit: 2021-04-29 | Discharge: 2021-04-29 | Disposition: A | Discharge status: Home / Self Care | Payer: 59 | Source: Ambulatory Visit | Attending: Physical Medicine and Rehabilitation | Admitting: Physical Medicine and Rehabilitation

## 2021-04-29 ENCOUNTER — Other Ambulatory Visit: Payer: Self-pay

## 2021-04-29 DIAGNOSIS — M5416 Radiculopathy, lumbar region: Secondary | ICD-10-CM

## 2021-04-29 IMAGING — MR MR LUMBAR SPINE W/O CM
4 of 5 series · 27 of 48 positions shown · non-contrast
Comparison: MRI lumbar spine [DATE]

CLINICAL DATA: Low back pain with right buttock and leg pain.

EXAM:
MRI LUMBAR SPINE WITHOUT CONTRAST
TECHNIQUE: Multiplanar, multisequence MR imaging of the lumbar spine was
performed. No intravenous contrast was administered.

[Series 3: T2 · sagittal · 4.0mm · 1.09mm/px · 6 of 17 slices shown (1 of 2)]
[im 1/17]
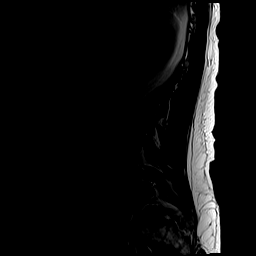
[im 4/17]
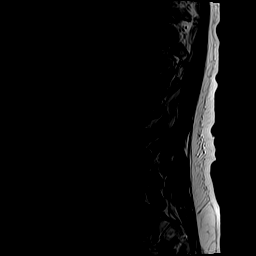
[im 7/17]
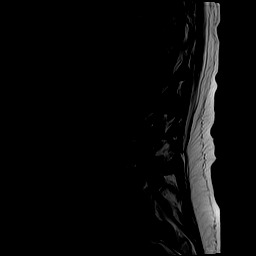
[im 10/17]
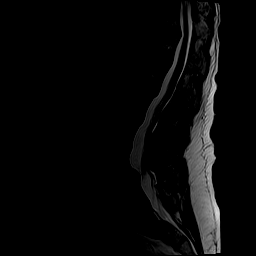
[im 13/17]
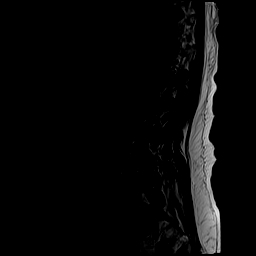
[im 17/17]
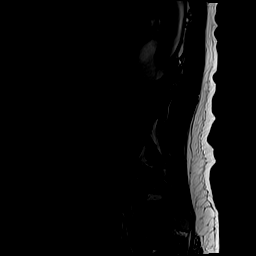

[Series 5: T1 · sagittal · 4.0mm · 1.09mm/px · 6 of 17 slices shown (1 of 2)]
[im 1/17]
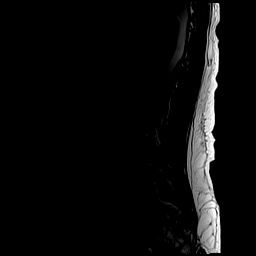
[im 4/17]
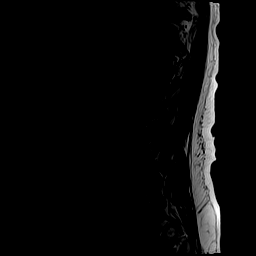
[im 7/17]
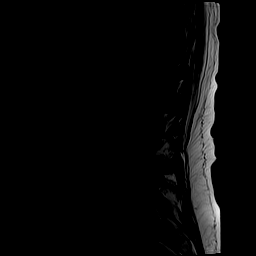
[im 10/17]
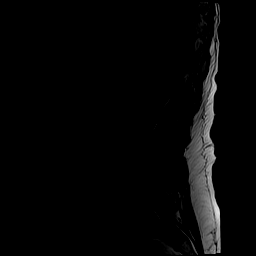
[im 13/17]
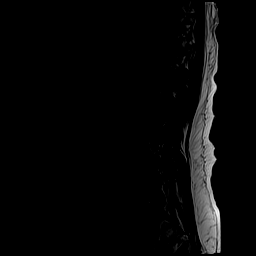
[im 17/17]
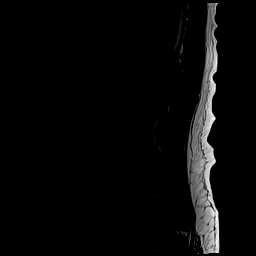

[Series 6: T2 · axial · 4.0mm · 0.39mm/px · z∈[-103,+116]mm · 9 of 42 slices shown (2 of 2)]
[im 1/42]
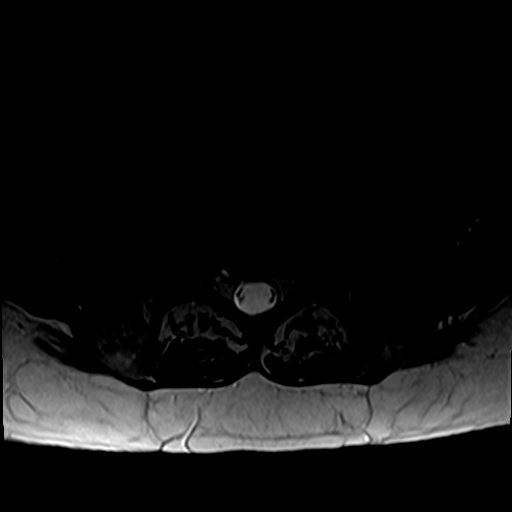
[im 6/42]
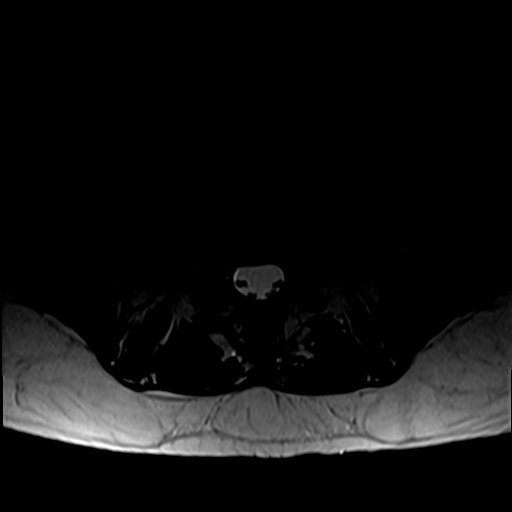
[im 12/42]
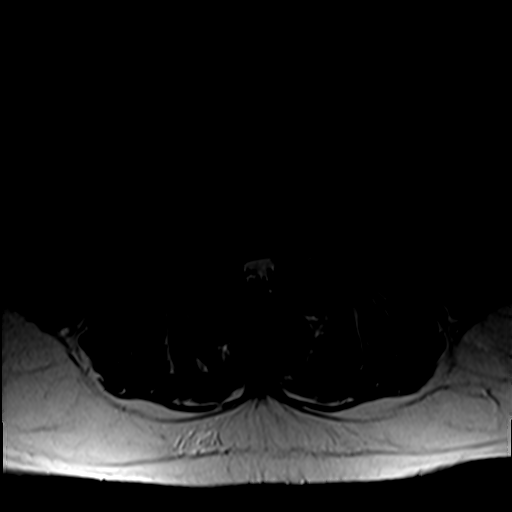
[im 18/42]
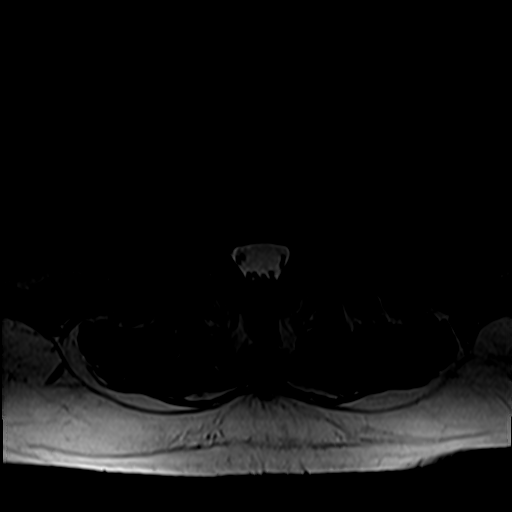
[im 21/42]
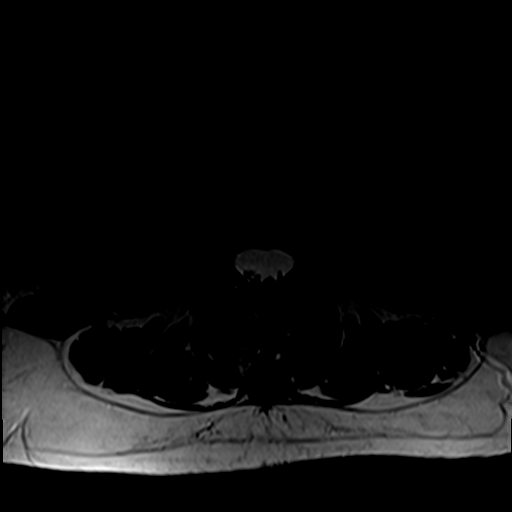
[im 24/42]
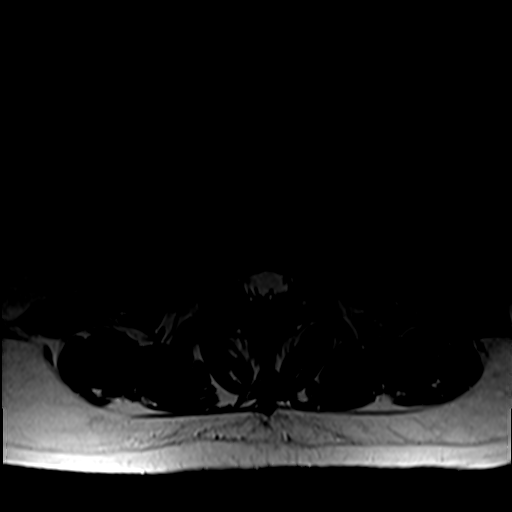
[im 30/42]
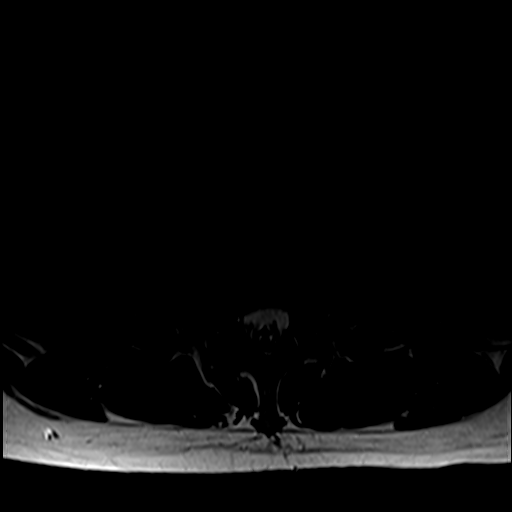
[im 36/42]
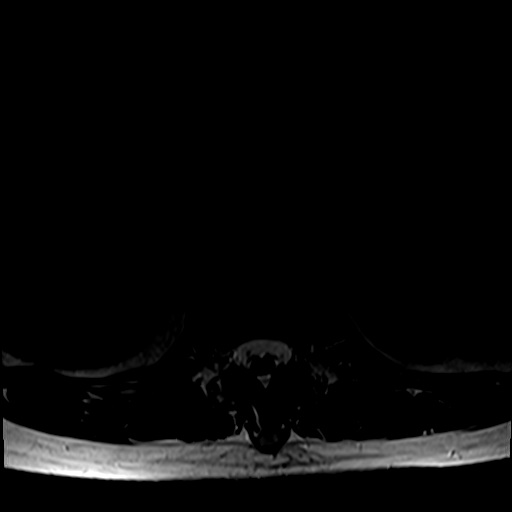
[im 42/42]
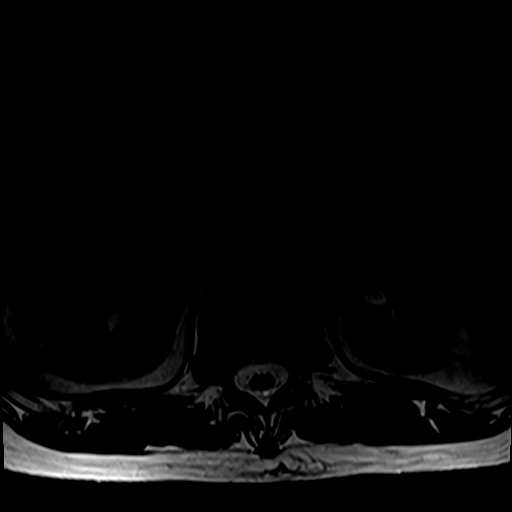

[Series 7: T1 · axial · 4.0mm · 0.39mm/px · z∈[-103,+87]mm · 6 of 42 slices shown (2 of 2)]
[im 1/42]
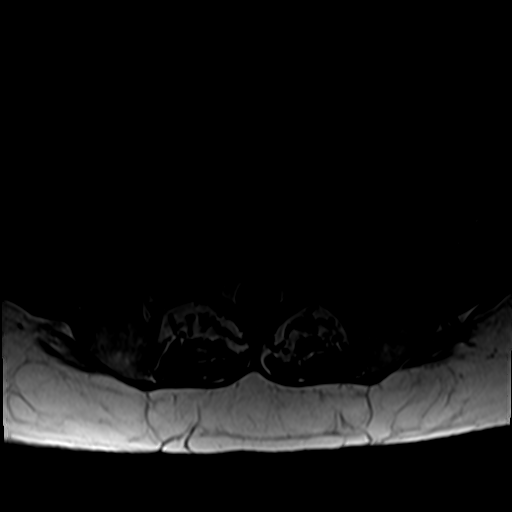
[im 6/42]
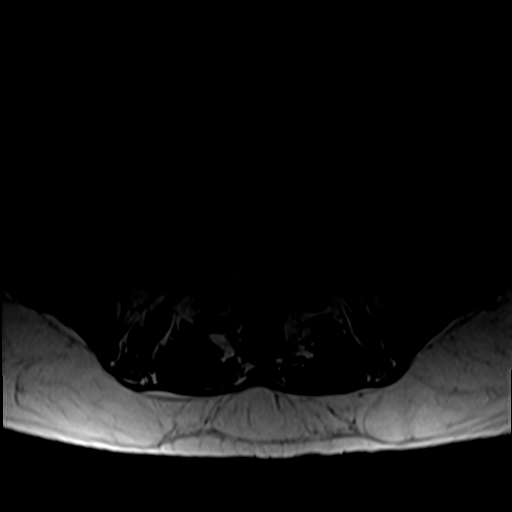
[im 12/42]
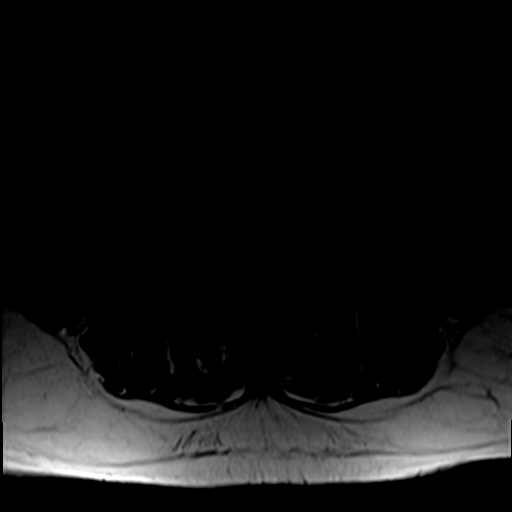
[im 18/42]
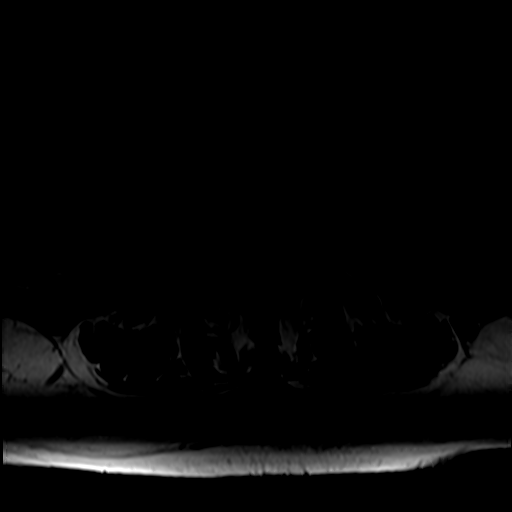
[im 21/42]
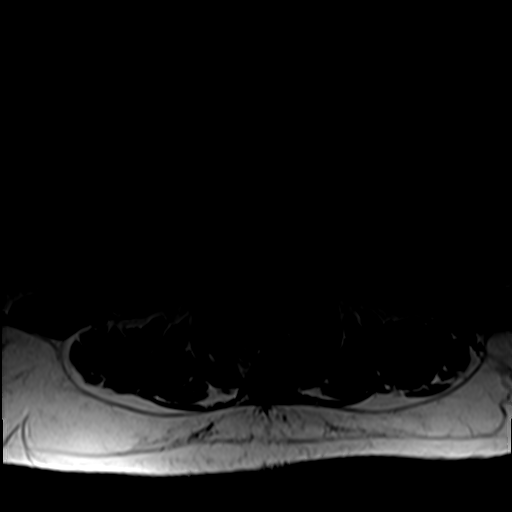
[im 36/42]
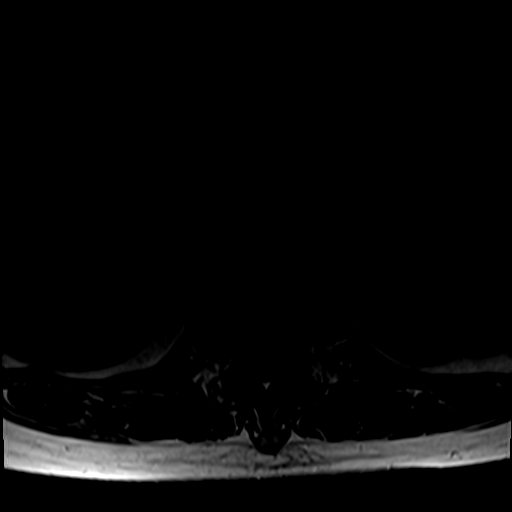

[27 of 48 positions shown; findings below may reference images not displayed]

FINDINGS: Segmentation:  Normal

Alignment: Mild retrolisthesis L1-2, L2-3, L3-4. 10 mm
anterolisthesis L4-5 unchanged

Vertebrae: Chronic pars defects of L4 unchanged. No acute fracture
or mass lesion.

Conus medullaris and cauda equina: Conus extends to the L1-2 level.
Conus and cauda equina appear normal.

Paraspinal and other soft tissues: Negative for paraspinous mass,
adenopathy, or fluid collection.

Disc levels:

T12-L1: Mild disc degeneration.  Negative for stenosis

L1-2: Mild disc degeneration.  Negative for stenosis

L2-3: Mild disc and mild facet degeneration. Mild subarticular
stenosis bilaterally

L3-4: Negative

L4-5: Prior laminectomy. Chronic pars defects of L4 with 10 mm
anterolisthesis. Severe subarticular and foraminal encroachment
bilaterally. Bilateral L4 nerve root compression in the foramen,
possibly with progression since the prior study.

L5-S1: Negative
IMPRESSION: Prior laminectomy at L4-5 bilaterally. Chronic pars defects of L4
with 10 mm anterolisthesis. Severe subarticular and foraminal
encroachment bilaterally. Bilateral L4 nerve root compression
foramen possibly with progression from the prior MRI [DATE].

## 2021-05-05 ENCOUNTER — Other Ambulatory Visit: Payer: Self-pay

## 2021-07-04 ENCOUNTER — Other Ambulatory Visit: Payer: Self-pay | Admitting: Family Medicine

## 2021-07-04 ENCOUNTER — Other Ambulatory Visit: Payer: Self-pay

## 2021-07-07 ENCOUNTER — Other Ambulatory Visit: Payer: Self-pay

## 2021-07-07 MED ORDER — ROSUVASTATIN CALCIUM 40 MG PO TABS
ORAL_TABLET | Freq: Every day | ORAL | 3 refills | Status: DC
Start: 2021-07-07 — End: 2022-09-14
  Filled 2021-07-07: qty 90, 90d supply, fill #0
  Filled 2021-10-06: qty 90, 90d supply, fill #1

## 2021-07-08 ENCOUNTER — Other Ambulatory Visit: Payer: Self-pay | Admitting: Neurosurgery

## 2021-07-10 ENCOUNTER — Other Ambulatory Visit: Payer: Self-pay

## 2021-07-17 ENCOUNTER — Other Ambulatory Visit: Payer: Self-pay

## 2021-07-17 ENCOUNTER — Encounter
Admission: RE | Admit: 2021-07-17 | Discharge: 2021-07-17 | Disposition: A | Discharge status: Home / Self Care | Payer: 59 | Source: Ambulatory Visit | Attending: Neurosurgery | Admitting: Neurosurgery

## 2021-07-17 DIAGNOSIS — Z01812 Encounter for preprocedural laboratory examination: Secondary | ICD-10-CM | POA: Insufficient documentation

## 2021-07-17 LAB — CBC
HCT: 40.6 % (ref 39.0–52.0)
Hemoglobin: 15.3 g/dL (ref 13.0–17.0)
MCH: 35.2 pg — ABNORMAL HIGH (ref 26.0–34.0)
MCHC: 37.7 g/dL — ABNORMAL HIGH (ref 30.0–36.0)
MCV: 93.3 fL (ref 80.0–100.0)
Platelets: 178 10*3/uL (ref 150–400)
RBC: 4.35 MIL/uL (ref 4.22–5.81)
RDW: 12.1 % (ref 11.5–15.5)
WBC: 5.7 10*3/uL (ref 4.0–10.5)
nRBC: 0 % (ref 0.0–0.2)

## 2021-07-17 LAB — BASIC METABOLIC PANEL
Anion gap: 8 (ref 5–15)
BUN: 13 mg/dL (ref 8–23)
CO2: 25 mmol/L (ref 22–32)
Calcium: 8.7 mg/dL — ABNORMAL LOW (ref 8.9–10.3)
Chloride: 97 mmol/L — ABNORMAL LOW (ref 98–111)
Creatinine, Ser: 0.69 mg/dL (ref 0.61–1.24)
GFR, Estimated: >60 mL/min (ref >60)
Glucose, Bld: 94 mg/dL (ref 70–99)
Potassium: 4.1 mmol/L (ref 3.5–5.1)
Sodium: 130 mmol/L — ABNORMAL LOW (ref 135–145)

## 2021-07-17 LAB — TYPE AND SCREEN
ABO/RH(D): B POS
Antibody Screen: NEGATIVE

## 2021-07-17 LAB — PROTIME-INR
INR: 1.1 (ref 0.8–1.2)
Prothrombin Time: 14.2 seconds (ref 11.4–15.2)

## 2021-07-17 LAB — SURGICAL PCR SCREEN
MRSA, PCR: NEGATIVE
Staphylococcus aureus: NEGATIVE

## 2021-07-17 LAB — APTT: aPTT: 25 seconds (ref 24–36)

## 2021-07-17 NOTE — Patient Instructions (Addendum)
Your procedure is scheduled on: 07/30/21 Report to [place]. To find out your arrival time please call [phone] between 1PM - 3PM on 07/29/21.  Remember: Instructions that are not followed completely may result in serious medical risk, up to and including death, or upon the discretion of your surgeon and anesthesiologist your surgery may need to be rescheduled.     _X__ 1. Do not eat food after midnight the night before your procedure.                 No gum chewing or hard candies. You may drink clear liquids up to 2 hours                 before you are scheduled to arrive for your surgery- DO not drink clear                 liquids within 2 hours of the start of your surgery.                 Clear Liquids include:  water, apple juice without pulp, clear carbohydrate                 drink such as Clearfast or Gatorade, Black Coffee or Tea (Do not add                 anything to coffee or tea). Diabetics water only  __X__2.  On the morning of surgery brush your teeth with toothpaste and water, you                 may rinse your mouth with mouthwash if you wish.  Do not swallow any              toothpaste of mouthwash.     _X__ 3.  No Alcohol for 24 hours before or after surgery.   _X__ 4.  Do Not Smoke or use e-cigarettes For 24 Hours Prior to Your Surgery.                 Do not use any chewable tobacco products for at least 6 hours prior to                 surgery.  ____  5.  Bring all medications with you on the day of surgery if instructed.   __X__  6.  Notify your doctor if there is any change in your medical condition      (cold, fever, infections).     Do not wear jewelry, make-up, hairpins, clips or nail polish. Do not wear lotions, powders, or perfumes. No deodorant Do not shave body hair 48 hours prior to surgery. Men may shave face and neck. Do not bring valuables to the hospital.    [facility] is not responsible  for any belongings or valuables.  Contacts, dentures/partials or body piercings may not be worn into surgery. Bring a case for your contacts, glasses or hearing aids, a denture cup will be supplied. Leave your suitcase in the car. After surgery it may be brought to your room. For patients admitted to the hospital, discharge time is determined by your treatment team.   Patients discharged the day of surgery will not be allowed to drive home.   Please read over the following fact sheets that you were given:   MRSA Information, CHG soap  __X__ Take these medicines the morning of surgery with A  SIP OF WATER:    1. cetirizine (ZYRTEC) 10 MG tablet  2. metoprolol succinate (TOPROL-XL) 25 MG 24 hr tablet  3. rosuvastatin (CRESTOR) 40 MG tablet  4.  5.  6.  ____ Fleet Enema (as directed)   __X__ Use CHG Soap/SAGE wipes as directed  ____ Use inhalers on the day of surgery  ____ Stop metformin/Janumet/Farxiga 2 days prior to surgery    ____ Take 1/2 of usual insulin dose the night before surgery. No insulin the morning          of surgery.   __X__ Stop Blood Thinners Plavix 14 DAYS PRIOR TO SURGERY ([place] did consider 10 days)  __X__ Stop Anti-inflammatories 7 days before surgery such as Advil, Ibuprofen, Motrin,  BC or Goodies Powder, Naprosyn, Naproxen, Aleve   __X__ Stop all herbal supplements, fish oil or vitamin E until after surgery. Stop the Niacin, Vitamin C, Fish oil, Melatonin, BIOTIN 7 days prior to surgery  ____ Bring C-Pap to the hospital.

## 2021-07-23 ENCOUNTER — Encounter: Payer: Self-pay | Admitting: Neurosurgery

## 2021-07-23 NOTE — Progress Notes (Signed)
Perioperative Services  Pre-Admission/Anesthesia Testing Clinical Review  Date: 07/24/21  Patient Demographics:  Name: Luke Glenn DOB:   03-Jul-1961 MRN:   [id number]  Planned Surgical Procedure(s):    Case: [id number] Date/Time: 07/30/21 0815   Procedures:      OPEN L4-5 TRANSFORAMINAL LUMBAR INTERBODY FUSION (TLIF) 1 LEVEL     BONE MARROW ASPIRATION FOR SPINE FUSION ONLY (BMAC)   Anesthesia type: General   Pre-op diagnosis:      Anterolisthesis M43.10     Pars defect of lumbar spine M43.06     Chronic bilateral low back pain with right-sided sciatica M54.41, G89.29   Location: [facility] OR ROOM 03 / [place] ORS FOR ANESTHESIA GROUP   Surgeons: Meade Maw, MD     NOTE: Available PAT nursing documentation and vital signs have been reviewed. Clinical nursing staff has updated patient's PMH/PSHx, current medication list, and drug allergies/intolerances to ensure comprehensive history available to assist in medical decision making as it pertains to the aforementioned surgical procedure and anticipated anesthetic course. Extensive review of available clinical information performed. [place] PMH and PSHx updated with any diagnoses/procedures that  may have been inadvertently omitted during his intake with the pre-admission testing department's nursing staff.  Clinical Discussion:  Luke Glenn is a 60 y.o. male who is submitted for pre-surgical anesthesia review and clearance prior to him undergoing the above procedure. Patient is a Former Smoker (10 pack years; quit 11/2000). Pertinent PMH includes: CAD (s/p multiple PCIs), anterior STEMI, HFpEF, valvular regurgitation, HTN, HLD, lumbosacral DDD, BPH.   Patient is followed by cardiology Loyal Buba, MD). He was last seen in the cardiology clinic on 06/20/2021; notes reviewed. At the time of his clinic visit, the patient denied any chest pain, shortness of breath, PND, orthopnea, palpitations, significant peripheral edema, vertiginous  symptoms, or presyncope/syncope.  Patient with a PMH significant for cardiovascular diagnoses.  Patient suffered an anterior wall STEMI on 07/12/2001.  He was [facility] to [facility] where he underwent PCI that revealed significant single-vessel CAD.  LVEF was 53%.  There was a 95% stenosis of the mid LAD, which was treated with placement of a 2.5 x 18 mm Cordis BX Velocity stent.  Diagnostic left heart catheterization performed on 11/25/2005.  LVEF was normal with mild anterior hypokinesis.  LVEDP 13 mmHg.  CAD noted, 25% ostial left main, 25% proximal LCx, 50% ISR of the LAD, 25% proximal D1, 95% proximal D2, and 25% RCA.  PCI was performed and a 2.5 x 16 mm Taxus DES x 1 placed to the proximal D2.  Diagnostic left heart catheterization repeated on 02/22/2007 revealing 20% stenosis of the LM, 25% stenosis of the proximal LCx, and 30% of the RCA.  Previously placed stent to the proximal D2 without evidence of ISR.  There was 90% ISR within the previously placed mid LAD stent, which was treated by placement of a 2.5 x 20 mm Taxus DES x 1.  Patient underwent diagnostic left heart catheterization on 09/19/2007 revealing an LVEF of 55%.  There was single-vessel CAD noted with 60% occlusion of the mid LAD.  FFR was performed and found to be low at 0.73-0.75.  PCI performed and a 2.5 x 18 Xience DES x 1 placed.  Most recent TTE performed on 06/20/2021 revealed mild left ventricular systolic function with an EF of 50%.  There was normal LA pressures with normal diastolic function.  There was trivial to mild mitral, pulmonary, and tricuspid valve regurgitation.  No evidence of valvular  stenosis.  Left ventricular septal wall hypokinetic.  Patient on daily DAPT therapy (ASA + clopidogrel); compliant with therapy with no evidence of GI bleeding.  Blood pressure well controlled at 92/60 on currently prescribed ACEi and beta-blocker therapies.  Patient is on a statin for his HLD.  He is not diabetic.  Overall  functional capacity limited by back pain, however patient still felt to be able to achieve at least 4 METS of activity without angina/anginal equivalent symptoms.  No changes were made to patient's medication regimen.  Patient to follow-up with outpatient cardiology in 1 year or sooner if needed.  [name] is scheduled for an OPEN L4-5 TRANSFORAMINAL LUMBAR INTERBODY FUSION (TLIF) on 07/30/2021 with Dr. Meade Maw.  Given patient's past medical history significant for cardiovascular diagnoses, presurgical cardiac clearance was sought by the PAT team. Per cardiology, "this patient is optimized for surgery and may proceed with the planned procedural course with a LOW risk of significant perioperative cardiovascular complications".  Again, this patient is on daily DAPT therapy.  Attending surgeon has discussed plans for these medications with patient's primary cardiologist.  Patient has been instructed to hold his clopidogrel for 7 days prior to his procedure with plans to resume 10 to 14 days postoperatively.  Given his extensive cardiovascular history, including placement of multiple coronary artery stents, both cardiology and neurosurgery agreed on allowing patient to continue his daily low-dose ASA throughout the perioperative period.  Patient denies previous perioperative complications with anesthesia in the past.  In review his EMR, there are no records available for review pertaining to past procedural/anesthetic courses within the [facility] system.  Vitals with BMI 07/17/2021 04/24/2021 01/21/2021  Height '5\' 9"'$  '5\' 9"'$  '5\' 9"'$   Weight 181 lbs 173 lbs 8 oz 171 lbs 12 oz  BMI 26.72 [id number]  Systolic [id number]  Diastolic 79 58 58  Pulse 64 61 73    Providers/Specialists:   NOTE: Primary physician provider listed below. Patient may have been seen by APP or partner within same practice.   PROVIDER ROLE / SPECIALTY LAST Dola Factor, MD Neurosurgery 05/23/2021  Lesleigh Noe, MD Primary Care Provider 04/24/2021  Lovena Neighbours, MD  Cardiology 06/20/2021   Allergies:  Patient has no known allergies.  Current Home Medications:   No current facility-administered medications for this encounter.    aspirin 81 MG tablet   azelastine (ASTELIN) 0.1 % nasal spray   Biotin 5000 MCG CAPS   cetirizine (ZYRTEC) 10 MG tablet   Cholecalciferol (VITAMIN D3) 50 MCG (2000 UT) TABS   clopidogrel (PLAVIX) 75 MG tablet   finasteride (PROSCAR) 5 MG tablet   fluticasone (FLONASE) 50 MCG/ACT nasal spray   lisinopril (ZESTRIL) 20 MG tablet   Magnesium Oxide 250 MG TABS   Melatonin 10 MG TABS   metoprolol succinate (TOPROL-XL) 25 MG 24 hr tablet   niacin 500 MG tablet   Omega-3 Fatty Acids (FISH OIL) 1000 MG CAPS   pregabalin (LYRICA) 75 MG capsule   rosuvastatin (CRESTOR) 40 MG tablet   Ascorbic Acid (VITAMIN C) 1000 MG tablet   History:   Past Medical History:  Diagnosis Date   (HFpEF) heart failure with preserved ejection fraction (HCC)    BPH (benign prostatic hyperplasia)    Chronic anticoagulation    a.) on DAPT therapy (ASA + clopidogrel)   Coronary artery disease    a.) PCI 07/11/2001: 95% mLAD -->  2.5 x 18 mm Cordis BX Velocity to mLAD.  b.) PCI 11/25/2005: 50% ISR mLAD; 95% pD2 --> 2.5 x 16 mm Taxus DES to pD2. c.) PCI 02/22/2007: 90% ISR mLAD --> 2.5 x 20 mm Taxus DES to mLAD. d.) PCI 09/19/2007: 60% mLAD; FFR low at 0.73-0.75 --> 2.5 x 18 mm Xience DES to mLAD.   DDD (degenerative disc disease), lumbosacral    Elevated lipids    Hypertension    Left renal atrophy    ST elevation myocardial infarction (STEMI) of anterior wall ([place]) 07/12/2001   a.) transferred to [facility]; PCI --> LVEF 53%; 95% mLAD - 2.5 x 18 mm Cordis BX Velocity stent placed.   Valvular regurgitation    a.) TTE on 06/20/2021 --> mild TV, PV; trivial MV   Past Surgical History:  Procedure Laterality Date   COLONOSCOPY WITH ESOPHAGOGASTRODUODENOSCOPY (EGD)     COLONOSCOPY WITH PROPOFOL  N/A 12/25/2015   Procedure: COLONOSCOPY WITH PROPOFOL;  Surgeon: Manya Silvas, MD;  Location: [place];  Service: Endoscopy;  Laterality: N/A;   CORONARY ANGIOPLASTY WITH STENT PLACEMENT Left 07/12/2001   Procedure: LEFT HEART CATHETERIZATION; PCI WITH STENT PLACEMENT (2.5 x 18 mm Cordis BX Velocity stent x 1 to mLAD); Location: [facility]; Surgeon: Pura Spice, MD   CORONARY ANGIOPLASTY WITH STENT PLACEMENT Left 02/22/2007   Procedure: LEFT HEART CATHETERIZATION; PCI WITH STENT PLACEMENT (2.5 x 20 mm Taxus DES to mLAD); Location: [facility]; Surgeon: Lovena Neighbours, MD   CORONARY ANGIOPLASTY WITH STENT PLACEMENT Left 09/19/2007   Procedure: LEFT HEART CATHETERIZATION; PCI WITH STENT PLACEMENT (2.5 x 18 mm Xience DES to mLAD); Location: [facility]; Surgeon: Joeseph Amor, MD   CORONARY ANGIOPLASTY WITH STENT PLACEMENT Left 11/25/2005   Procedure: LEFT HEART CATHETERIZATION; PCI WITH STENT PLACEMENT (2.5 x 16 mm Taxus DES to pD2); Location: [facility]; Surgeon: ???, MD   MENISCECTOMY Right 02/08/1995   Procedure: MEDIAL MENISCECTOMY; Location: [facility]; Surgeon: Kelli Hope, MD   Family History  Problem Relation Age of Onset   Dementia Mother    Diabetes Father    Heart attack Father    Bladder Cancer Father    Stroke Maternal Grandmother    Heart attack Maternal Grandmother    Cancer Maternal Grandfather        unknown type   Gout Maternal Grandfather    Diabetes Paternal Grandmother    Heart attack Paternal Grandfather    Kidney cancer Neg Hx    Prostate cancer Neg Hx    Social History   Tobacco Use   Smoking status: Former    Packs/day: 0.50    Years: 20.00    Pack years: 10.00    Types: Cigars, Cigarettes    Quit date: 2002    Years since quitting: 20.7   Smokeless tobacco: Never  Vaping Use   Vaping Use: Never used  Substance Use Topics   Alcohol use: Yes    Comment: 1-2 beers less than 3 times a week   Drug use: No    Pertinent Clinical Results:  LABS: Labs reviewed: Acceptable  for surgery.  No visits with results within 3 Day(s) from this visit.  Latest known visit with results is:  Hospital Outpatient Visit on 07/17/2021  Component Date Value Ref Range Status   aPTT 07/17/2021 25  24 - 36 seconds Final   Performed at [facility], [place], [place] [address]   Sodium 07/17/2021 130 (A) 135 - 145 mmol/L Final   Potassium 07/17/2021 4.1  3.5 - 5.1 mmol/L Final   Chloride 07/17/2021 97 (A)  98 - 111 mmol/L Final   CO2 07/17/2021 25  22 - 32 mmol/L Final   Glucose, Bld 07/17/2021 94  70 - 99 mg/dL Final   Glucose reference range applies only to samples taken after fasting for at least 8 hours.   BUN 07/17/2021 13  8 - 23 mg/dL Final   Creatinine, Ser 07/17/2021 0.69  0.61 - 1.24 mg/dL Final   Calcium 07/17/2021 8.7 (A) 8.9 - 10.3 mg/dL Final   GFR, Estimated 07/17/2021 >60  >60 mL/min Final   Comment: (NOTE) Calculated using the CKD-EPI Creatinine Equation (2021)    Anion gap 07/17/2021 8  5 - 15 Final   Performed at [facility], [address] [place], [place] [address]   WBC 07/17/2021 5.7  4.0 - 10.5 K/uL Final   RBC 07/17/2021 4.35  4.22 - 5.81 MIL/uL Final   Hemoglobin 07/17/2021 15.3  13.0 - 17.0 g/dL Final   HCT 07/17/2021 40.6  39.0 - 52.0 % Final   MCV 07/17/2021 93.3  80.0 - 100.0 fL Final   MCH 07/17/2021 35.2 (A) 26.0 - 34.0 pg Final   MCHC 07/17/2021 37.7 (A) 30.0 - 36.0 g/dL Final   RDW 07/17/2021 12.1  11.5 - 15.5 % Final   Platelets 07/17/2021 178  150 - 400 K/uL Final   nRBC 07/17/2021 0.0  0.0 - 0.2 % Final   Performed at [facility], [address] [place], [place] [address]   Prothrombin Time 07/17/2021 14.2  11.4 - 15.2 seconds Final   INR 07/17/2021 1.1  0.8 - 1.2 Final   Comment: (NOTE) INR goal varies based on device and disease states. Performed at [facility], [address] [place], [place] [address]    MRSA, PCR 07/17/2021 NEGATIVE  NEGATIVE Final   Staphylococcus  aureus 07/17/2021 NEGATIVE  NEGATIVE Final   Comment: (NOTE) The Xpert SA Assay (FDA approved for NASAL specimens in patients 43 years of age and older), is one component of a comprehensive surveillance program. It is not intended to diagnose infection nor to guide or monitor treatment. Performed at [facility], [address] [place], [place] [address]    ABO/RH(D) 07/17/2021 B POS   Final   Antibody Screen 07/17/2021 NEG   Final   Sample Expiration 07/17/2021 07/31/2021,2359   Final   Extend sample reason 07/17/2021    Final                   Value:NO TRANSFUSIONS OR PREGNANCY IN THE PAST 3 MONTHS Performed at [facility], [address] [place], [place] [address]     ECG: Date: 06/20/2021 Rate: 54 bpm Rhythm:  Sinus rhythm Intervals: PR 54 ms. QRS 84 ms. QTc 384 ms. ST segment and T wave changes: ST elevation likely due to early repolarization  Comparison: Similar to previous tracing obtained on 10/15/2017 NOTE: Tracing obtained at [facility]; unable for review. Above based on cardiologist's interpretation.    IMAGING / PROCEDURES: TRANSTHORACIC ECHOCARDIOGRAM performed on 06/20/2021 Mild left ventricular systolic dysfunction with an EF of 50% Left ventricular GLS -16.1% Normal LA pressures with normal diastolic function Normal right ventricular systolic function Trivial MR Mild TR and PR No AR No valvular stenosis Hypokinetic left ventricular septal wall Mild right ventricular and atrial enlargement No evidence of pericardial effusion  LEFT HEART CATHETERIZATION AND CORONARY ANGIOGRAPHY performed on 09/19/2007 LVEF 55% 60% ISR of the previously placed stents to the mid LAD  FFR low at 0.73-0.75  PCI performed and a 2.5 x 18 mm Xience DES x 1 placed to the mid LAD   LEFT HEART CATHETERIZATION AND CORONARY ANGIOGRAPHY performed on 02/22/2007 LVEF 55% 80% ISR of the previously placed stent to the mid LAD PCI performed and a 2.5 x 20 mm  Taxus DES placed to the mid LAD  LEFT HEART CATHETERIZATION AND CORONARY ANGIOGRAPHY performed on 11/25/2005 Normal left ventricular systolic function with mild anterior hypokinesis LVEDP 13 Multivessel CAD with significant disease in single vessel 25% ostial LM 25% proximal LCx 50% ISR of previously placed mid LAD stent 25% proximal D1 95% proximal D2 25% RCA PCI performed and a 2.5 x 16 mm Taxus DES was placed to the proximal D2.   LEFT HEART CATHETERIZATION AND CORONARY ANGIOGRAPHY performed on 07/12/2001 LVEF 53% 95% stenosis mid LAD PCI performed and 2.5 x 18 mm Cordis BX Velocity stent placed to mid LAD.   Impression and Plan:  GILLIE ADDARIO has been referred for pre-anesthesia review and clearance prior to him undergoing the planned anesthetic and procedural courses. Available labs, pertinent testing, and imaging results were personally reviewed by me. This patient has been appropriately cleared by cardiology with an overall LOW risk of significant perioperative cardiovascular complications.  Based on clinical review performed today (07/24/21), barring any significant acute changes in the patient's overall condition, it is anticipated that he will be able to proceed with the planned surgical intervention. Any acute changes in clinical condition may necessitate his procedure being postponed and/or cancelled. Patient will meet with anesthesia team (MD and/or CRNA) on the day of his procedure for preoperative evaluation/assessment. Questions regarding anesthetic course will be fielded at that time.   Pre-surgical instructions were reviewed with the patient during his PAT appointment and questions were fielded by PAT clinical staff. Patient was advised that if any questions or concerns arise prior to his procedure then he should return a call to PAT and/or his surgeon's office to discuss.  Honor Loh, MSN, APRN, FNP-C, CEN [facility]  Peri-operative Services Nurse  Practitioner Phone: [phone] Fax: [phone] 07/24/21 10:02 AM  NOTE: This note has been prepared using Dragon dictation software. Despite my best ability to proofread, there is always the potential that unintentional transcriptional errors may still occur from this process.

## 2021-07-24 ENCOUNTER — Encounter: Payer: Self-pay | Admitting: Neurosurgery

## 2021-07-24 ENCOUNTER — Other Ambulatory Visit: Payer: Self-pay

## 2021-07-28 ENCOUNTER — Other Ambulatory Visit
Admission: RE | Admit: 2021-07-28 | Discharge: 2021-07-28 | Disposition: A | Discharge status: Home / Self Care | Payer: 59 | Source: Ambulatory Visit | Attending: Neurosurgery | Admitting: Neurosurgery

## 2021-07-28 ENCOUNTER — Other Ambulatory Visit: Payer: Self-pay

## 2021-07-28 DIAGNOSIS — Z20822 Contact with and (suspected) exposure to covid-19: Secondary | ICD-10-CM | POA: Insufficient documentation

## 2021-07-28 DIAGNOSIS — Z01812 Encounter for preprocedural laboratory examination: Secondary | ICD-10-CM | POA: Insufficient documentation

## 2021-07-28 LAB — SARS CORONAVIRUS 2 (TAT 6-24 HRS): SARS Coronavirus 2: NEGATIVE

## 2021-07-29 MED ORDER — ONDANSETRON HCL 4 MG/2ML IJ SOLN
INTRAMUSCULAR | Status: AC
  Filled 2021-07-29: qty 2

## 2021-07-29 MED ORDER — PROPOFOL 10 MG/ML IV BOLUS
INTRAVENOUS | Status: AC
  Filled 2021-07-29: qty 20

## 2021-07-29 MED ORDER — LIDOCAINE HCL (PF) 2 % IJ SOLN
INTRAMUSCULAR | Status: AC
  Filled 2021-07-29: qty 5

## 2021-07-30 ENCOUNTER — Encounter: Payer: Self-pay | Admitting: Neurosurgery

## 2021-07-30 ENCOUNTER — Inpatient Hospital Stay: Payer: 59 | Admitting: Urgent Care

## 2021-07-30 ENCOUNTER — Encounter
Admission: RE | Disposition: A | Discharge status: Home / Self Care | Payer: Self-pay | Source: Home / Self Care | Attending: Neurosurgery

## 2021-07-30 ENCOUNTER — Inpatient Hospital Stay: Payer: 59

## 2021-07-30 ENCOUNTER — Other Ambulatory Visit: Payer: Self-pay

## 2021-07-30 ENCOUNTER — Inpatient Hospital Stay
Admission: RE | Admit: 2021-07-30 | Discharge: 2021-07-31 | DRG: 454 | Disposition: A | Discharge status: Home / Self Care | Payer: 59 | Attending: Neurosurgery | Admitting: Neurosurgery

## 2021-07-30 DIAGNOSIS — Z955 Presence of coronary angioplasty implant and graft: Secondary | ICD-10-CM | POA: Diagnosis not present

## 2021-07-30 DIAGNOSIS — Z7901 Long term (current) use of anticoagulants: Secondary | ICD-10-CM

## 2021-07-30 DIAGNOSIS — Z79899 Other long term (current) drug therapy: Secondary | ICD-10-CM

## 2021-07-30 DIAGNOSIS — Z419 Encounter for procedure for purposes other than remedying health state, unspecified: Secondary | ICD-10-CM

## 2021-07-30 DIAGNOSIS — N400 Enlarged prostate without lower urinary tract symptoms: Secondary | ICD-10-CM | POA: Diagnosis present

## 2021-07-30 DIAGNOSIS — G8929 Other chronic pain: Secondary | ICD-10-CM | POA: Diagnosis present

## 2021-07-30 DIAGNOSIS — Z7982 Long term (current) use of aspirin: Secondary | ICD-10-CM

## 2021-07-30 DIAGNOSIS — Z7902 Long term (current) use of antithrombotics/antiplatelets: Secondary | ICD-10-CM

## 2021-07-30 DIAGNOSIS — Z87891 Personal history of nicotine dependence: Secondary | ICD-10-CM | POA: Diagnosis not present

## 2021-07-30 DIAGNOSIS — Z8249 Family history of ischemic heart disease and other diseases of the circulatory system: Secondary | ICD-10-CM

## 2021-07-30 DIAGNOSIS — I2510 Atherosclerotic heart disease of native coronary artery without angina pectoris: Secondary | ICD-10-CM | POA: Diagnosis present

## 2021-07-30 DIAGNOSIS — Z20822 Contact with and (suspected) exposure to covid-19: Secondary | ICD-10-CM | POA: Diagnosis present

## 2021-07-30 DIAGNOSIS — Z981 Arthrodesis status: Secondary | ICD-10-CM

## 2021-07-30 DIAGNOSIS — E785 Hyperlipidemia, unspecified: Secondary | ICD-10-CM | POA: Diagnosis present

## 2021-07-30 DIAGNOSIS — M5441 Lumbago with sciatica, right side: Secondary | ICD-10-CM | POA: Diagnosis present

## 2021-07-30 DIAGNOSIS — I5032 Chronic diastolic (congestive) heart failure: Secondary | ICD-10-CM | POA: Diagnosis present

## 2021-07-30 DIAGNOSIS — I110 Hypertensive heart disease with heart failure: Secondary | ICD-10-CM | POA: Diagnosis present

## 2021-07-30 DIAGNOSIS — M5416 Radiculopathy, lumbar region: Secondary | ICD-10-CM | POA: Diagnosis present

## 2021-07-30 DIAGNOSIS — N261 Atrophy of kidney (terminal): Secondary | ICD-10-CM | POA: Diagnosis present

## 2021-07-30 DIAGNOSIS — M4316 Spondylolisthesis, lumbar region: Principal | ICD-10-CM | POA: Diagnosis present

## 2021-07-30 DIAGNOSIS — I252 Old myocardial infarction: Secondary | ICD-10-CM

## 2021-07-30 HISTORY — DX: Long term (current) use of anticoagulants: Z79.01

## 2021-07-30 HISTORY — DX: Atrophy of kidney (terminal): N26.1

## 2021-07-30 HISTORY — DX: Benign prostatic hyperplasia without lower urinary tract symptoms: N40.0

## 2021-07-30 HISTORY — DX: Presence of coronary angioplasty implant and graft: Z95.5

## 2021-07-30 HISTORY — PX: MAXIMUM ACCESS (MAS) TRANSFORAMINAL LUMBAR INTERBODY FUSION (TLIF) 1 LEVEL: SHX6392

## 2021-07-30 HISTORY — DX: Endocarditis, valve unspecified: I38

## 2021-07-30 HISTORY — DX: Unspecified diastolic (congestive) heart failure: I50.30

## 2021-07-30 LAB — ABO/RH: ABO/RH(D): B POS

## 2021-07-30 IMAGING — XA DG C-ARM 1-60 MIN
1 series · 2 of 2 positions shown · non-contrast
Comparison: MRI lumbar spine [DATE].

CLINICAL DATA: Surgery.  TLIF L4-L5.

EXAM:
DG C-ARM 1-60 MIN; LUMBAR SPINE - 2-3 VIEW
FLUOROSCOPY TIME:  Fluoroscopy Time:  49 seconds
Number of Acquired Spot Images: 2

[Series 1: dg x-ray · 0.20mm/px · 2 of 2 slices shown]
[im 1/2]
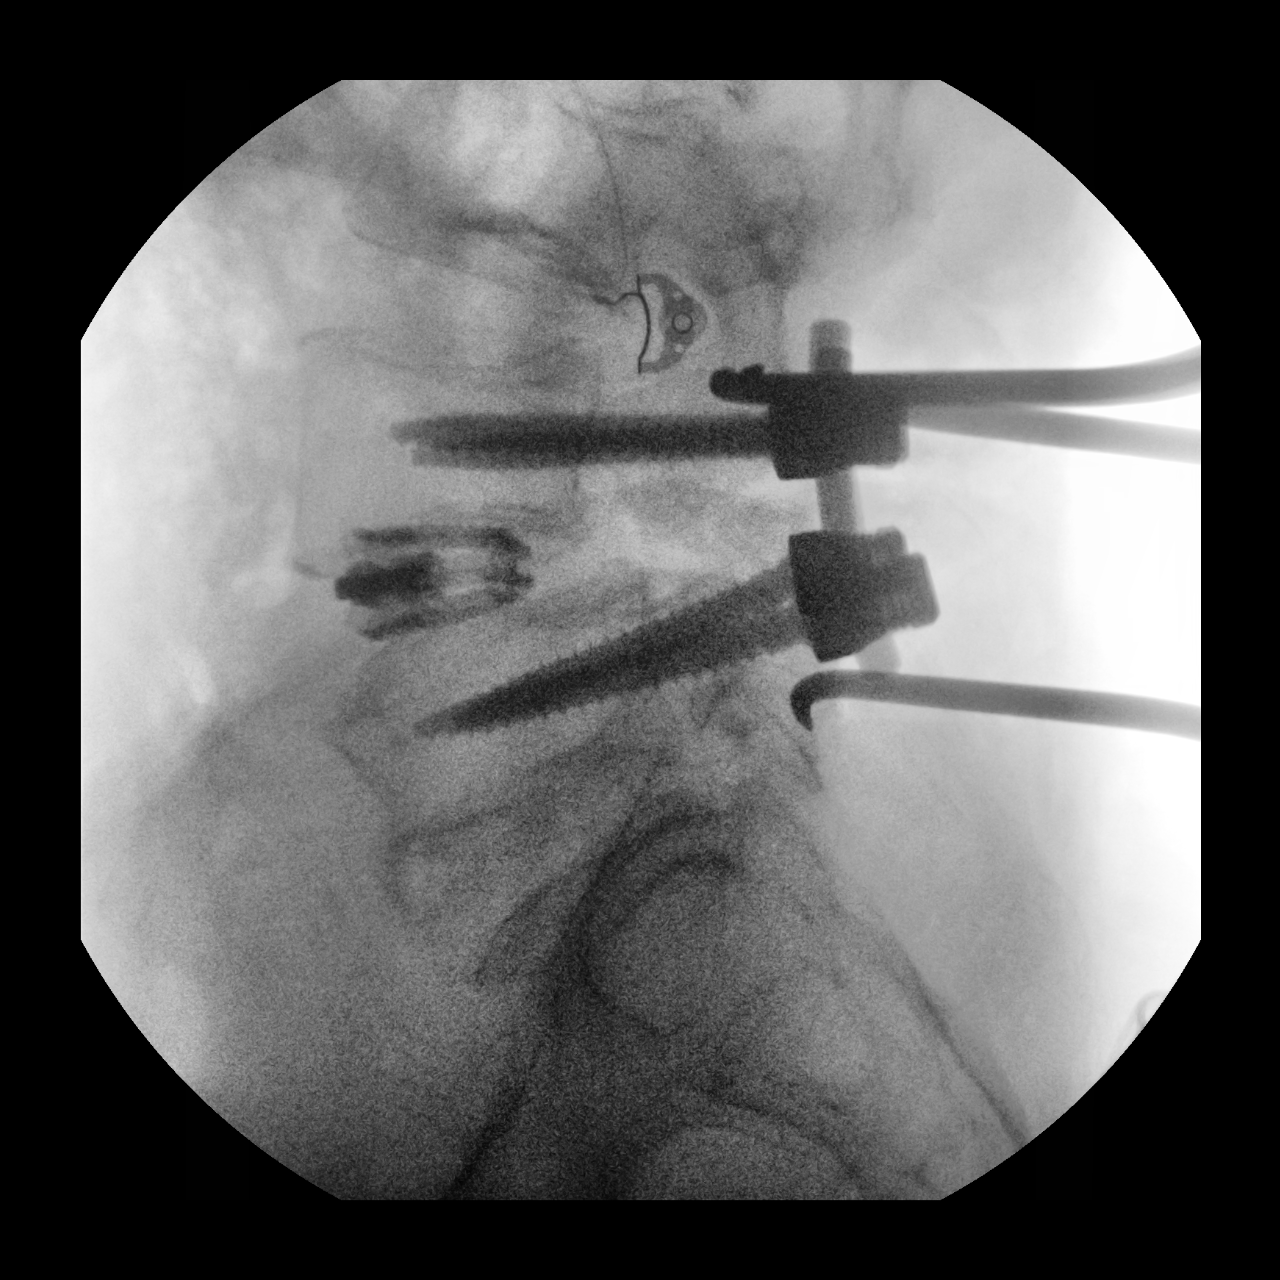
[im 2/2]
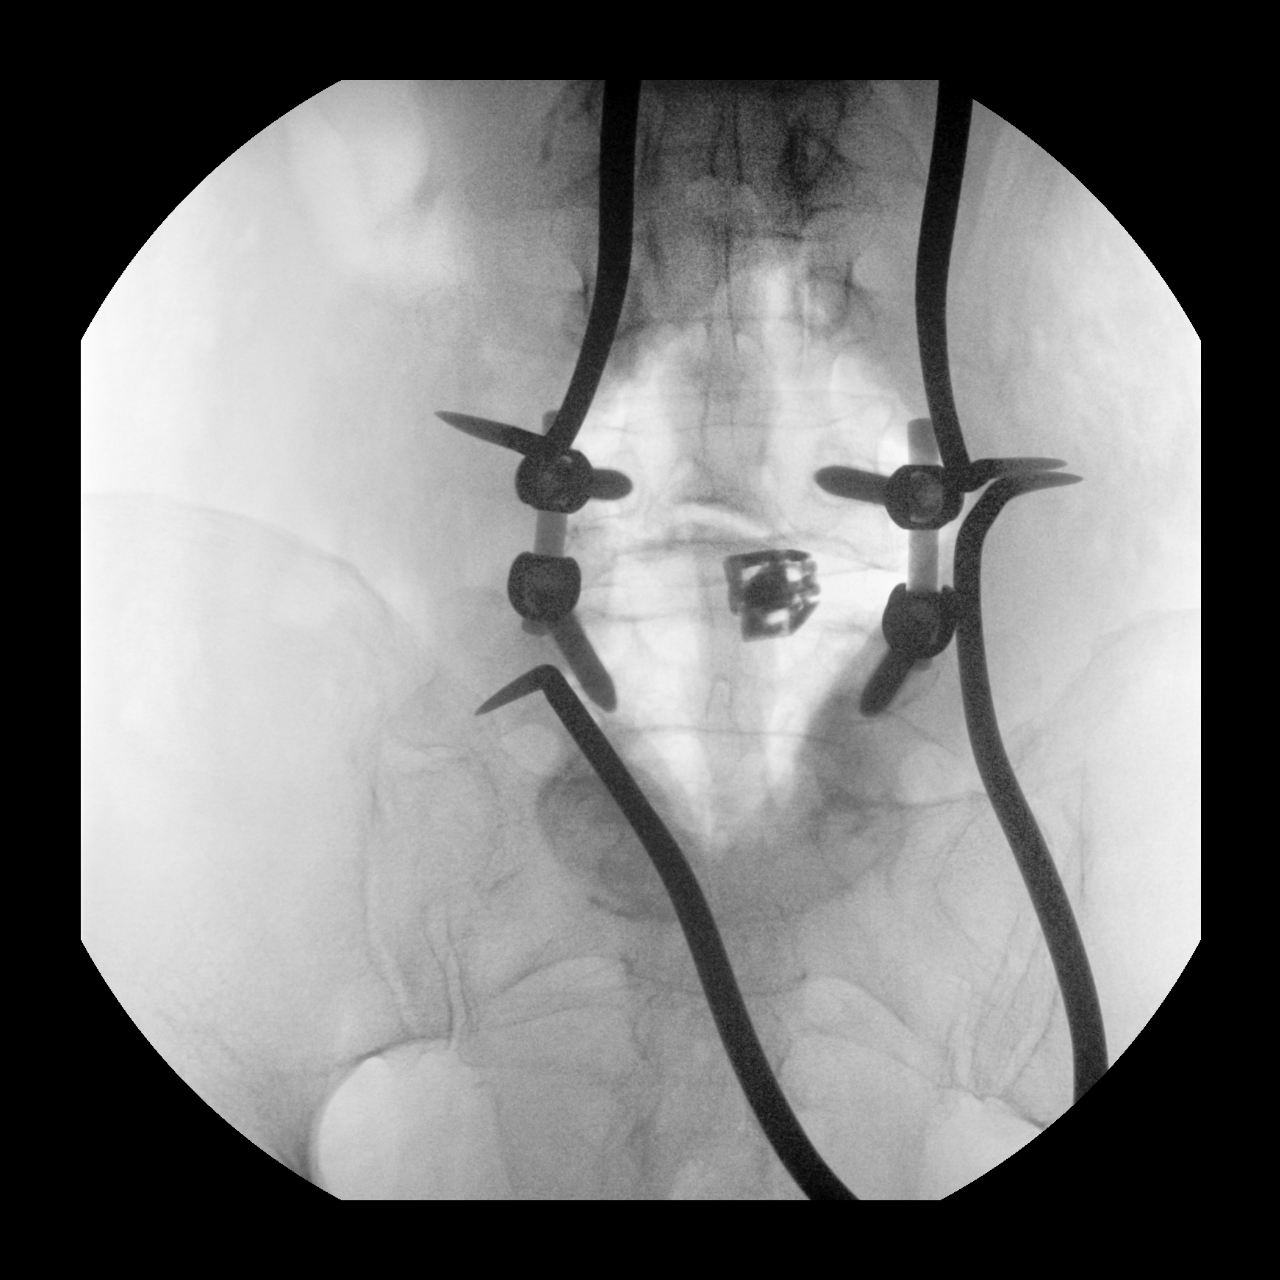

[2 of 2 positions shown; findings below may reference images not displayed]

FINDINGS: Two C-arm fluoroscopic images were obtained intraoperatively and
submitted for post operative interpretation. These images
demonstrate bilateral pedicle screws at L4 and L5 with intervening
rods and L4-L5 spacer. Please see the performing provider's
procedural report for further detail.
IMPRESSION: Intraoperative fluoroscopy, as detailed above.

## 2021-07-30 IMAGING — XA DG C-ARM 1-60 MIN
1 series · 2 of 2 positions shown · non-contrast
Comparison: MRI lumbar spine [DATE].

CLINICAL DATA: Surgery.  TLIF L4-L5.

EXAM:
DG C-ARM 1-60 MIN; LUMBAR SPINE - 2-3 VIEW
FLUOROSCOPY TIME:  Fluoroscopy Time:  49 seconds
Number of Acquired Spot Images: 2

[Series 1: dg x-ray · 0.20mm/px · 2 of 2 slices shown]
[im 1/2]
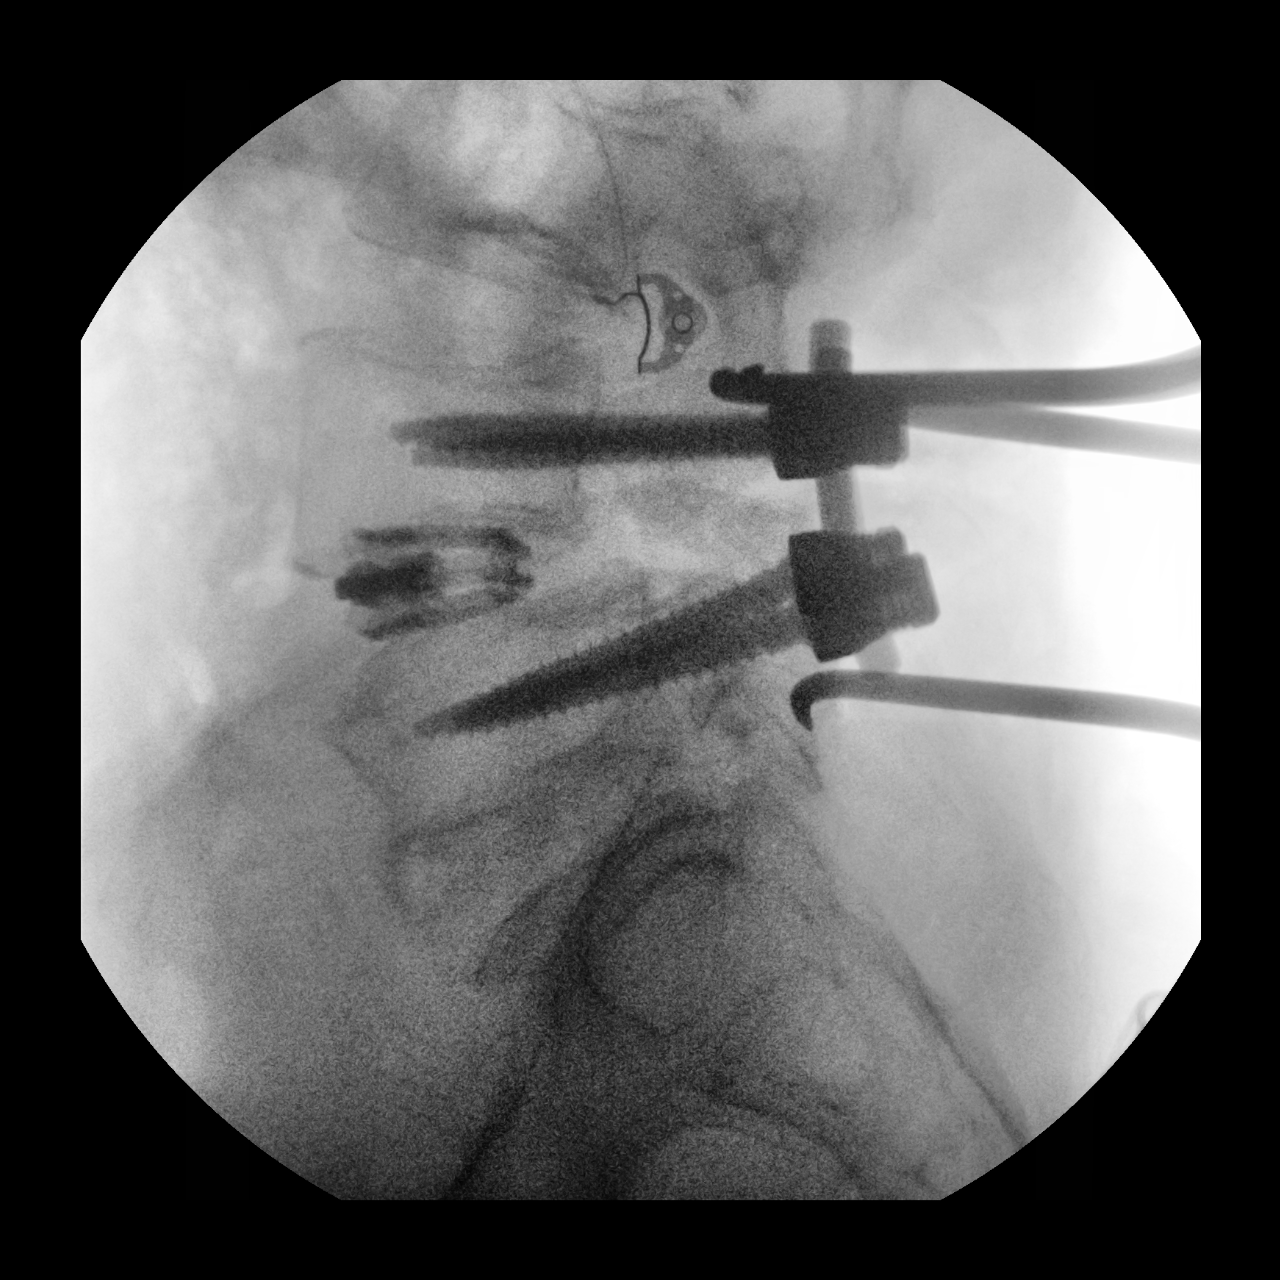
[im 2/2]
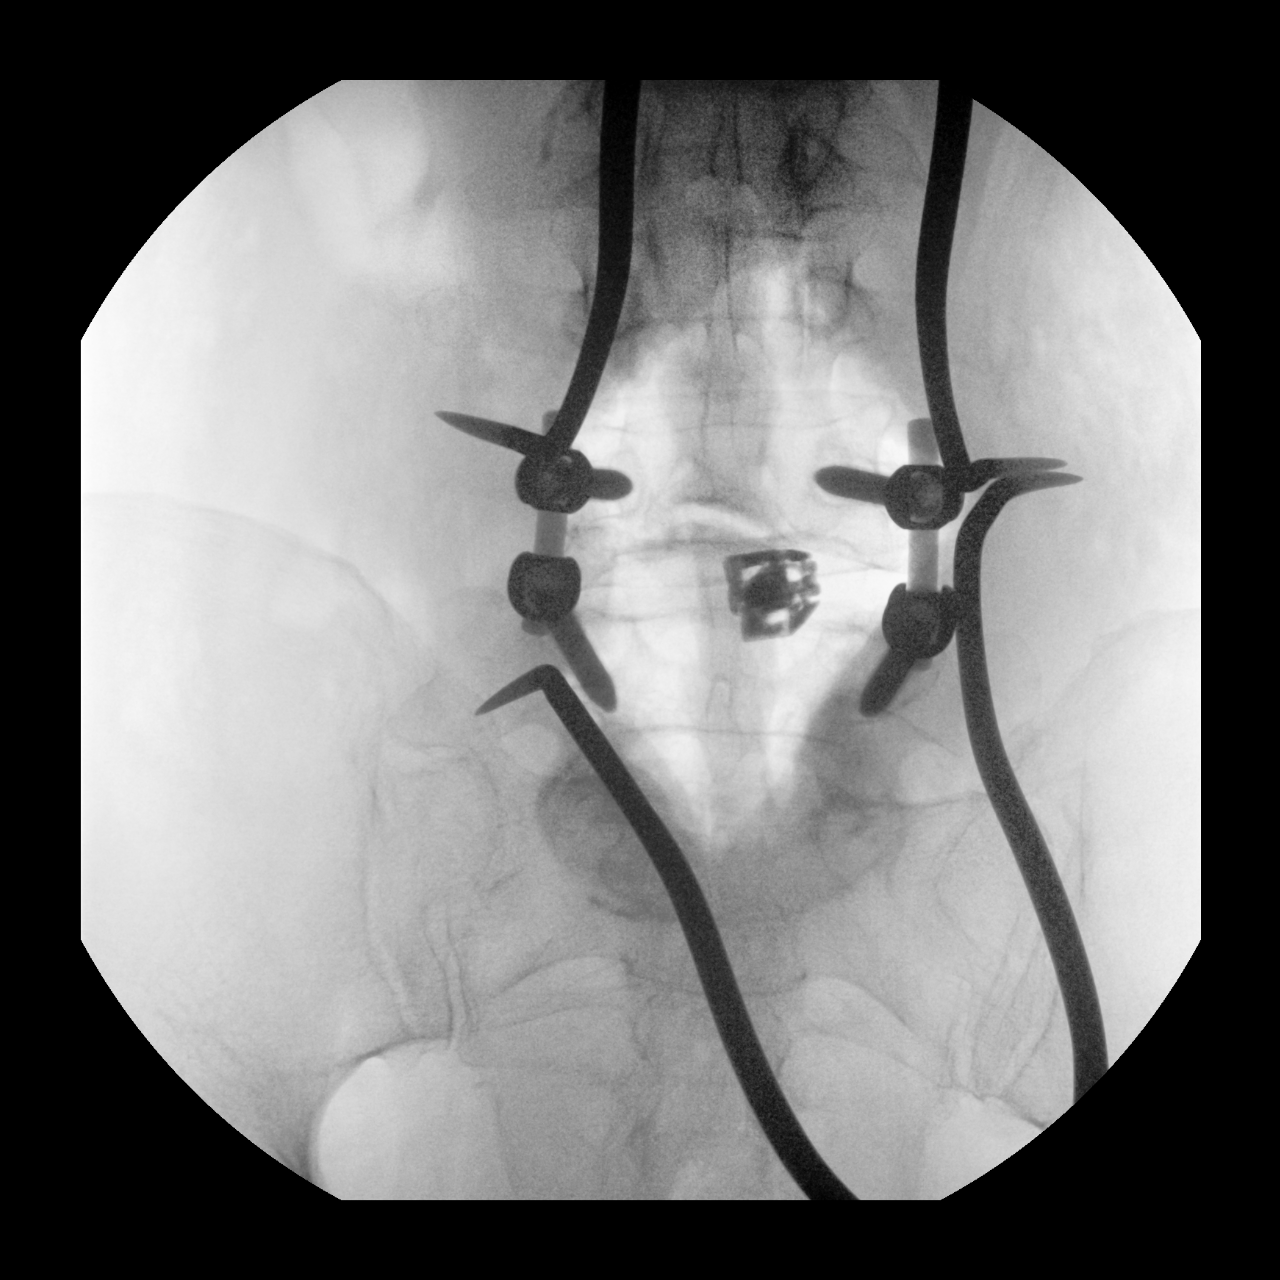

[2 of 2 positions shown; findings below may reference images not displayed]

FINDINGS: Two C-arm fluoroscopic images were obtained intraoperatively and
submitted for post operative interpretation. These images
demonstrate bilateral pedicle screws at L4 and L5 with intervening
rods and L4-L5 spacer. Please see the performing provider's
procedural report for further detail.
IMPRESSION: Intraoperative fluoroscopy, as detailed above.

## 2021-07-30 SURGERY — MAXIMUM ACCESS (MAS) TRANSFORAMINAL LUMBAR INTERBODY FUSION (TLIF) 1 LEVEL
Anesthesia: General

## 2021-07-30 MED ORDER — KETOROLAC TROMETHAMINE 15 MG/ML IJ SOLN
INTRAMUSCULAR | Status: AC
  Administered 2021-07-30: 15 mg via INTRAVENOUS
  Filled 2021-07-30: qty 1

## 2021-07-30 MED ORDER — ROCURONIUM BROMIDE 100 MG/10ML IV SOLN
INTRAVENOUS | Status: DC | PRN
  Administered 2021-07-30: 30 mg via INTRAVENOUS

## 2021-07-30 MED ORDER — REMIFENTANIL HCL 1 MG IV SOLR
INTRAVENOUS | Status: AC
  Filled 2021-07-30: qty 1000

## 2021-07-30 MED ORDER — MIDAZOLAM HCL 2 MG/2ML IJ SOLN
INTRAMUSCULAR | Status: AC
  Filled 2021-07-30: qty 2

## 2021-07-30 MED ORDER — DEXAMETHASONE SODIUM PHOSPHATE 10 MG/ML IJ SOLN
INTRAMUSCULAR | Status: AC
  Filled 2021-07-30: qty 1

## 2021-07-30 MED ORDER — MORPHINE SULFATE (PF) 2 MG/ML IV SOLN: 2.0000 mg | INTRAVENOUS | Status: AC | PRN

## 2021-07-30 MED ORDER — KETOROLAC TROMETHAMINE 15 MG/ML IJ SOLN
15.0000 mg | Freq: Four times a day (QID) | INTRAMUSCULAR | Status: AC
  Administered 2021-07-30 – 2021-07-31 (×3): 15 mg via INTRAVENOUS
  Filled 2021-07-30 (×3): qty 1

## 2021-07-30 MED ORDER — HEPARIN SODIUM (PORCINE) 10000 UNIT/ML IJ SOLN
INTRAMUSCULAR | Status: DC | PRN
  Administered 2021-07-30: 10000 [IU] via SUBCUTANEOUS

## 2021-07-30 MED ORDER — SODIUM CHLORIDE 0.9 % IV SOLN
INTRAVENOUS | Status: DC | PRN
  Administered 2021-07-30: .08 ug/kg/min via INTRAVENOUS

## 2021-07-30 MED ORDER — SODIUM CHLORIDE 0.9% FLUSH: 3.0000 mL | INTRAVENOUS | Status: DC | PRN

## 2021-07-30 MED ORDER — ONDANSETRON HCL 4 MG/2ML IJ SOLN
INTRAMUSCULAR | Status: AC
  Filled 2021-07-30: qty 2

## 2021-07-30 MED ORDER — EPHEDRINE 5 MG/ML INJ
INTRAVENOUS | Status: AC
  Filled 2021-07-30: qty 5

## 2021-07-30 MED ORDER — ACETAMINOPHEN 325 MG PO TABS
650.0000 mg | ORAL_TABLET | ORAL | Status: DC
  Administered 2021-07-30 – 2021-07-31 (×5): 650 mg via ORAL
  Filled 2021-07-30 (×8): qty 2

## 2021-07-30 MED ORDER — CHLORHEXIDINE GLUCONATE 0.12 % MT SOLN
OROMUCOSAL | Status: AC
  Administered 2021-07-30: 15 mL via OROMUCOSAL
  Filled 2021-07-30: qty 15

## 2021-07-30 MED ORDER — FENTANYL CITRATE (PF) 100 MCG/2ML IJ SOLN
INTRAMUSCULAR | Status: AC
  Administered 2021-07-30: 25 ug via INTRAVENOUS
  Filled 2021-07-30: qty 2

## 2021-07-30 MED ORDER — METHOCARBAMOL 500 MG PO TABS
500.0000 mg | ORAL_TABLET | Freq: Four times a day (QID) | ORAL | Status: DC
  Administered 2021-07-30 – 2021-07-31 (×4): 500 mg via ORAL
  Filled 2021-07-30 (×5): qty 1

## 2021-07-30 MED ORDER — LORATADINE 10 MG PO TABS
10.0000 mg | ORAL_TABLET | Freq: Every day | ORAL | Status: DC
  Administered 2021-07-31: 10 mg via ORAL
  Filled 2021-07-30: qty 1

## 2021-07-30 MED ORDER — SODIUM CHLORIDE 0.9% FLUSH
3.0000 mL | Freq: Two times a day (BID) | INTRAVENOUS | Status: DC
  Administered 2021-07-30 – 2021-07-31 (×3): 3 mL via INTRAVENOUS

## 2021-07-30 MED ORDER — 0.9 % SODIUM CHLORIDE (POUR BTL) OPTIME
TOPICAL | Status: DC | PRN
  Administered 2021-07-30: 1000 mL

## 2021-07-30 MED ORDER — ACETAMINOPHEN 10 MG/ML IV SOLN
INTRAVENOUS | Status: DC | PRN
  Administered 2021-07-30: 1000 mg via INTRAVENOUS

## 2021-07-30 MED ORDER — PREGABALIN 75 MG PO CAPS
75.0000 mg | ORAL_CAPSULE | Freq: Two times a day (BID) | ORAL | Status: DC
  Administered 2021-07-30 – 2021-07-31 (×2): 75 mg via ORAL
  Filled 2021-07-30 (×2): qty 1

## 2021-07-30 MED ORDER — FENTANYL CITRATE (PF) 100 MCG/2ML IJ SOLN
INTRAMUSCULAR | Status: AC
  Filled 2021-07-30: qty 2

## 2021-07-30 MED ORDER — DEXAMETHASONE SODIUM PHOSPHATE 10 MG/ML IJ SOLN
INTRAMUSCULAR | Status: DC | PRN
  Administered 2021-07-30: 10 mg via INTRAVENOUS

## 2021-07-30 MED ORDER — PROPOFOL 10 MG/ML IV BOLUS
INTRAVENOUS | Status: DC | PRN
  Administered 2021-07-30: 100 mg via INTRAVENOUS

## 2021-07-30 MED ORDER — FAMOTIDINE 20 MG PO TABS: 20.0000 mg | ORAL_TABLET | Freq: Once | ORAL | Status: AC

## 2021-07-30 MED ORDER — FLEET ENEMA 7-19 GM/118ML RE ENEM: 1.0000 | ENEMA | Freq: Once | RECTAL | Status: DC | PRN

## 2021-07-30 MED ORDER — HEMOSTATIC AGENTS (NO CHARGE) OPTIME
TOPICAL | Status: DC | PRN
  Administered 2021-07-30: 1 via TOPICAL

## 2021-07-30 MED ORDER — FAMOTIDINE 20 MG PO TABS
ORAL_TABLET | ORAL | Status: AC
  Administered 2021-07-30: 20 mg via ORAL
  Filled 2021-07-30: qty 1

## 2021-07-30 MED ORDER — FENTANYL CITRATE (PF) 100 MCG/2ML IJ SOLN
INTRAMUSCULAR | Status: DC | PRN
  Administered 2021-07-30: 100 ug via INTRAVENOUS

## 2021-07-30 MED ORDER — BUPIVACAINE HCL (PF) 0.5 % IJ SOLN
INTRAMUSCULAR | Status: DC | PRN
  Administered 2021-07-30: 20 mL

## 2021-07-30 MED ORDER — VANCOMYCIN HCL 1250 MG/250ML IV SOLN
1250.0000 mg | Freq: Once | INTRAVENOUS | Status: AC
  Administered 2021-07-30: 1250 mg via INTRAVENOUS
  Filled 2021-07-30: qty 250

## 2021-07-30 MED ORDER — CHLORHEXIDINE GLUCONATE 0.12 % MT SOLN
15.0000 mL | Freq: Once | OROMUCOSAL | Status: AC
Start: 2021-07-30 — End: 2021-07-30

## 2021-07-30 MED ORDER — ONDANSETRON HCL 4 MG/2ML IJ SOLN: 4.0000 mg | Freq: Once | INTRAMUSCULAR | Status: DC | PRN

## 2021-07-30 MED ORDER — BUPIVACAINE-EPINEPHRINE (PF) 0.5% -1:200000 IJ SOLN
INTRAMUSCULAR | Status: DC | PRN
  Administered 2021-07-30: 10 mL

## 2021-07-30 MED ORDER — BISACODYL 10 MG RE SUPP: 10.0000 mg | Freq: Every day | RECTAL | Status: DC | PRN

## 2021-07-30 MED ORDER — METOPROLOL SUCCINATE ER 25 MG PO TB24
25.0000 mg | ORAL_TABLET | Freq: Every day | ORAL | Status: DC
  Administered 2021-07-31: 25 mg via ORAL
  Filled 2021-07-30: qty 1

## 2021-07-30 MED ORDER — OXYCODONE HCL 5 MG/5ML PO SOLN: 5.0000 mg | Freq: Once | ORAL | Status: AC | PRN

## 2021-07-30 MED ORDER — ROCURONIUM BROMIDE 10 MG/ML (PF) SYRINGE
PREFILLED_SYRINGE | INTRAVENOUS | Status: AC
  Filled 2021-07-30: qty 10

## 2021-07-30 MED ORDER — ACETAMINOPHEN 650 MG RE SUPP
650.0000 mg | RECTAL | Status: DC
  Filled 2021-07-30 (×7): qty 1

## 2021-07-30 MED ORDER — MIDAZOLAM HCL 2 MG/2ML IJ SOLN
INTRAMUSCULAR | Status: DC | PRN
  Administered 2021-07-30 (×2): 1 mg via INTRAVENOUS

## 2021-07-30 MED ORDER — PROPOFOL 1000 MG/100ML IV EMUL
INTRAVENOUS | Status: AC
  Filled 2021-07-30: qty 100

## 2021-07-30 MED ORDER — ASCORBIC ACID 500 MG PO TABS
2000.0000 mg | ORAL_TABLET | Freq: Every day | ORAL | Status: DC
  Administered 2021-07-31: 2000 mg via ORAL
  Filled 2021-07-30: qty 4

## 2021-07-30 MED ORDER — PHENYLEPHRINE HCL (PRESSORS) 10 MG/ML IV SOLN
INTRAVENOUS | Status: AC
  Filled 2021-07-30: qty 1

## 2021-07-30 MED ORDER — SUCCINYLCHOLINE CHLORIDE 200 MG/10ML IV SOSY
PREFILLED_SYRINGE | INTRAVENOUS | Status: DC | PRN
  Administered 2021-07-30: 100 mg via INTRAVENOUS

## 2021-07-30 MED ORDER — CEFAZOLIN SODIUM-DEXTROSE 2-4 GM/100ML-% IV SOLN
INTRAVENOUS | Status: AC
  Filled 2021-07-30: qty 100

## 2021-07-30 MED ORDER — SENNA 8.6 MG PO TABS
1.0000 | ORAL_TABLET | Freq: Two times a day (BID) | ORAL | Status: DC
  Administered 2021-07-30 – 2021-07-31 (×3): 8.6 mg via ORAL
  Filled 2021-07-30 (×3): qty 1

## 2021-07-30 MED ORDER — SODIUM CHLORIDE 0.9 % IV SOLN: INTRAVENOUS | Status: DC | PRN

## 2021-07-30 MED ORDER — LIDOCAINE HCL (CARDIAC) PF 100 MG/5ML IV SOSY
PREFILLED_SYRINGE | INTRAVENOUS | Status: DC | PRN
  Administered 2021-07-30: 100 mg via INTRAVENOUS

## 2021-07-30 MED ORDER — LISINOPRIL 20 MG PO TABS
20.0000 mg | ORAL_TABLET | Freq: Every day | ORAL | Status: DC
  Administered 2021-07-30 – 2021-07-31 (×2): 20 mg via ORAL
  Filled 2021-07-30: qty 1

## 2021-07-30 MED ORDER — ACETAMINOPHEN 10 MG/ML IV SOLN: 1000.0000 mg | Freq: Once | INTRAVENOUS | Status: DC | PRN

## 2021-07-30 MED ORDER — LACTATED RINGERS IV SOLN: INTRAVENOUS | Status: DC

## 2021-07-30 MED ORDER — CEFAZOLIN SODIUM-DEXTROSE 2-4 GM/100ML-% IV SOLN
2.0000 g | Freq: Once | INTRAVENOUS | Status: AC
  Administered 2021-07-30: 2 g via INTRAVENOUS

## 2021-07-30 MED ORDER — THROMBIN 5000 UNITS EX SOLR
CUTANEOUS | Status: DC | PRN
  Administered 2021-07-30: 5000 [IU] via TOPICAL

## 2021-07-30 MED ORDER — ONDANSETRON HCL 4 MG/2ML IJ SOLN
INTRAMUSCULAR | Status: DC | PRN
  Administered 2021-07-30: 4 mg via INTRAVENOUS

## 2021-07-30 MED ORDER — POLYETHYLENE GLYCOL 3350 17 G PO PACK: 17.0000 g | PACK | Freq: Every day | ORAL | Status: DC | PRN

## 2021-07-30 MED ORDER — SODIUM CHLORIDE 0.9 % IV SOLN
INTRAVENOUS | Status: DC | PRN
  Administered 2021-07-30: 40 mL

## 2021-07-30 MED ORDER — LACTATED RINGERS IV SOLN: INTRAVENOUS | Status: DC | PRN

## 2021-07-30 MED ORDER — PROPOFOL 500 MG/50ML IV EMUL
INTRAVENOUS | Status: DC | PRN
  Administered 2021-07-30: 100 ug/kg/min via INTRAVENOUS

## 2021-07-30 MED ORDER — SODIUM CHLORIDE 0.9 % IV SOLN
250.0000 mL | INTRAVENOUS | Status: DC
  Administered 2021-07-30: 250 mL via INTRAVENOUS

## 2021-07-30 MED ORDER — MENTHOL 3 MG MT LOZG
1.0000 | LOZENGE | OROMUCOSAL | Status: DC | PRN
  Administered 2021-07-30: 3 mg via ORAL
  Filled 2021-07-30 (×2): qty 9

## 2021-07-30 MED ORDER — SUCCINYLCHOLINE CHLORIDE 200 MG/10ML IV SOSY
PREFILLED_SYRINGE | INTRAVENOUS | Status: AC
  Filled 2021-07-30: qty 10

## 2021-07-30 MED ORDER — GLYCOPYRROLATE 0.2 MG/ML IJ SOLN
INTRAMUSCULAR | Status: DC | PRN
Start: 2021-07-30 — End: 2021-07-30
  Administered 2021-07-30: .2 mg via INTRAVENOUS

## 2021-07-30 MED ORDER — FINASTERIDE 5 MG PO TABS
5.0000 mg | ORAL_TABLET | Freq: Every day | ORAL | Status: DC
  Administered 2021-07-30: 5 mg via ORAL
  Filled 2021-07-30: qty 1

## 2021-07-30 MED ORDER — ORAL CARE MOUTH RINSE: 15.0000 mL | Freq: Once | OROMUCOSAL | Status: AC

## 2021-07-30 MED ORDER — PHENOL 1.4 % MT LIQD
1.0000 | OROMUCOSAL | Status: DC | PRN
  Filled 2021-07-30: qty 177

## 2021-07-30 MED ORDER — OXYCODONE HCL 5 MG PO TABS: 5.0000 mg | ORAL_TABLET | Freq: Once | ORAL | Status: AC | PRN

## 2021-07-30 MED ORDER — OXYCODONE HCL 5 MG PO TABS: 10.0000 mg | ORAL_TABLET | ORAL | Status: DC | PRN

## 2021-07-30 MED ORDER — SUGAMMADEX SODIUM 200 MG/2ML IV SOLN
INTRAVENOUS | Status: DC | PRN
Start: 2021-07-30 — End: 2021-07-30
  Administered 2021-07-30: 164.2 mg via INTRAVENOUS

## 2021-07-30 MED ORDER — OXYCODONE HCL 5 MG PO TABS
ORAL_TABLET | ORAL | Status: AC
  Administered 2021-07-30: 5 mg via ORAL
  Filled 2021-07-30: qty 1

## 2021-07-30 MED ORDER — SODIUM CHLORIDE 0.9 % IV SOLN: INTRAVENOUS | Status: DC

## 2021-07-30 MED ORDER — EPHEDRINE SULFATE 50 MG/ML IJ SOLN
INTRAMUSCULAR | Status: DC | PRN
  Administered 2021-07-30: 10 mg via INTRAVENOUS
  Administered 2021-07-30: 5 mg via INTRAVENOUS

## 2021-07-30 MED ORDER — ONDANSETRON HCL 4 MG PO TABS: 4.0000 mg | ORAL_TABLET | Freq: Four times a day (QID) | ORAL | Status: DC | PRN

## 2021-07-30 MED ORDER — ROSUVASTATIN CALCIUM 20 MG PO TABS
40.0000 mg | ORAL_TABLET | Freq: Every day | ORAL | Status: DC
  Administered 2021-07-31: 40 mg via ORAL
  Filled 2021-07-30: qty 4
  Filled 2021-07-30: qty 2

## 2021-07-30 MED ORDER — SODIUM CHLORIDE 0.9 % IV SOLN
INTRAVENOUS | Status: DC | PRN
  Administered 2021-07-30: 25 ug/min via INTRAVENOUS

## 2021-07-30 MED ORDER — FENTANYL CITRATE (PF) 100 MCG/2ML IJ SOLN
25.0000 ug | INTRAMUSCULAR | Status: DC | PRN
  Administered 2021-07-30 (×3): 25 ug via INTRAVENOUS

## 2021-07-30 MED ORDER — OXYCODONE HCL 5 MG PO TABS: 5.0000 mg | ORAL_TABLET | ORAL | Status: DC | PRN

## 2021-07-30 MED ORDER — AZELASTINE HCL 0.1 % NA SOLN
2.0000 | Freq: Every day | NASAL | Status: DC | PRN
  Filled 2021-07-30: qty 30

## 2021-07-30 MED ORDER — ONDANSETRON HCL 4 MG/2ML IJ SOLN: 4.0000 mg | Freq: Four times a day (QID) | INTRAMUSCULAR | Status: DC | PRN

## 2021-07-30 MED ORDER — GLYCOPYRROLATE 0.2 MG/ML IJ SOLN
INTRAMUSCULAR | Status: AC
  Filled 2021-07-30: qty 1

## 2021-07-30 MED ORDER — BIOTIN 5000 MCG PO CAPS: 5000.0000 ug | ORAL_CAPSULE | Freq: Every day | ORAL | Status: DC

## 2021-07-30 SURGICAL SUPPLY — 80 items
BLADE BOVIE TIP EXT 4 (BLADE) ×2 IMPLANT
BONE CANC CHIPS 20CC PCAN1/4 (Bone Implant) ×2 IMPLANT
BUR NEURO DRILL SOFT 3.0X3.8M (BURR) ×2 IMPLANT
CAP LOCKING THREADED (Cap) ×8 IMPLANT
CHIPS CANC BONE 20CC PCAN1/4 (Bone Implant) ×1 IMPLANT
CHLORAPREP W/TINT 26 (MISCELLANEOUS) ×2 IMPLANT
CNTNR SPEC 2.5X3XGRAD LEK (MISCELLANEOUS) ×1
CONT SPEC 4OZ STER OR WHT (MISCELLANEOUS) ×1
CONTAINER SPEC 2.5X3XGRAD LEK (MISCELLANEOUS) ×1 IMPLANT
COUNTER NEEDLE 20/40 LG (NEEDLE) ×2 IMPLANT
CUP MEDICINE 2OZ PLAST GRAD ST (MISCELLANEOUS) ×4 IMPLANT
DERMABOND ADVANCED (GAUZE/BANDAGES/DRESSINGS) ×1
DERMABOND ADVANCED .7 DNX12 (GAUZE/BANDAGES/DRESSINGS) ×1 IMPLANT
DRAIN CHANNEL JP 10F RND 20C F (MISCELLANEOUS) IMPLANT
DRAPE 3D C-ARM OEC (DRAPES) ×2 IMPLANT
DRAPE C ARM PK CFD 31 SPINE (DRAPES) ×2 IMPLANT
DRAPE INCISE IOBAN 66X45 STRL (DRAPES) ×2 IMPLANT
DRAPE LAPAROTOMY 100X77 ABD (DRAPES) ×2 IMPLANT
DRAPE MICROSCOPE SPINE 48X150 (DRAPES) IMPLANT
DRAPE SCAN PATIENT (DRAPES) IMPLANT
DRAPE SURG 17X11 SM STRL (DRAPES) ×8 IMPLANT
DRSG OPSITE POSTOP 4X8 (GAUZE/BANDAGES/DRESSINGS) ×2 IMPLANT
DRSG TEGADERM 4X4.75 (GAUZE/BANDAGES/DRESSINGS) IMPLANT
ELECT CAUTERY BLADE TIP 2.5 (TIP) ×2
ELECT EZSTD 165MM 6.5IN (MISCELLANEOUS)
ELECT REM PT RETURN 9FT ADLT (ELECTROSURGICAL) ×2
ELECTRODE CAUTERY BLDE TIP 2.5 (TIP) ×1 IMPLANT
ELECTRODE EZSTD 165MM 6.5IN (MISCELLANEOUS) IMPLANT
ELECTRODE REM PT RTRN 9FT ADLT (ELECTROSURGICAL) ×1 IMPLANT
FEE INTRAOP CADWELL SUPPLY NCS (MISCELLANEOUS) ×1 IMPLANT
FEE INTRAOP MONITOR IMPULS NCS (MISCELLANEOUS) IMPLANT
GAUZE 4X4 16PLY ~~LOC~~+RFID DBL (SPONGE) ×4 IMPLANT
GAUZE XEROFORM 4X4 STRL (GAUZE/BANDAGES/DRESSINGS) IMPLANT
GLOVE SURG SYN 6.5 ES PF (GLOVE) ×8 IMPLANT
GLOVE SURG SYN 8.5  E (GLOVE) ×3
GLOVE SURG SYN 8.5 E (GLOVE) ×3 IMPLANT
GLOVE SURG UNDER POLY LF SZ6.5 (GLOVE) ×6 IMPLANT
GOWN SRG LRG LVL 4 IMPRV REINF (GOWNS) ×3 IMPLANT
GOWN SRG XL LVL 3 NONREINFORCE (GOWNS) ×1 IMPLANT
GOWN STRL NON-REIN TWL XL LVL3 (GOWNS) ×1
GOWN STRL REIN LRG LVL4 (GOWNS) ×3
GRADUATE 1200CC STRL 31836 (MISCELLANEOUS) ×2 IMPLANT
GRAFT DURAGEN MATRIX 1WX1L (Tissue) IMPLANT
HEMOVAC 400CC 10FR (MISCELLANEOUS) ×2 IMPLANT
INTERBODY SABLE 10X26 7-14 15D (Miscellaneous) ×2 IMPLANT
INTRAOP CADWELL SUPPLY FEE NCS (MISCELLANEOUS) ×1
INTRAOP DISP SUPPLY FEE NCS (MISCELLANEOUS) ×1
INTRAOP MONITOR FEE IMPULS NCS (MISCELLANEOUS)
INTRAOP MONITOR FEE IMPULSE (MISCELLANEOUS)
KIT ASPI BONE MRW 60ML (KITS) ×2 IMPLANT
KIT SPINAL PRONEVIEW (KITS) ×2 IMPLANT
MANIFOLD NEPTUNE II (INSTRUMENTS) ×2 IMPLANT
MARKER SKIN DUAL TIP RULER LAB (MISCELLANEOUS) ×4 IMPLANT
MILL MEDIUM DISP (BLADE) IMPLANT
NDL SAFETY ECLIPSE 18X1.5 (NEEDLE) ×1 IMPLANT
NEEDLE HYPO 18GX1.5 SHARP (NEEDLE) ×1
NEEDLE HYPO 22GX1.5 SAFETY (NEEDLE) ×2 IMPLANT
NS IRRIG 1000ML POUR BTL (IV SOLUTION) ×2 IMPLANT
PACK LAMINECTOMY NEURO (CUSTOM PROCEDURE TRAY) ×2 IMPLANT
PENCIL ELECTRO HAND CTR (MISCELLANEOUS) ×2 IMPLANT
PUTTY DBX 10CC (Bone Implant) ×2 IMPLANT
ROD 40MM SPINAL (Rod) ×2 IMPLANT
ROD CREO 45MM SPINAL (Rod) ×2 IMPLANT
SCREW CREO SPINAL 6.5X50 (Screw) ×8 IMPLANT
SPOGE SURGIFLO 8M (HEMOSTASIS) ×1
SPONGE DRAIN TRACH 4X4 STRL 2S (GAUZE/BANDAGES/DRESSINGS) IMPLANT
SPONGE GAUZE 2X2 8PLY STRL LF (GAUZE/BANDAGES/DRESSINGS) ×2 IMPLANT
SPONGE SURGIFLO 8M (HEMOSTASIS) ×1 IMPLANT
STAPLER SKIN PROX 35W (STAPLE) ×2 IMPLANT
SURGIFLO W/THROMBIN 8M KIT (HEMOSTASIS) ×2 IMPLANT
SUT DVC VLOC 3-0 CL 6 P-12 (SUTURE) IMPLANT
SUT VIC AB 0 CT1 27 (SUTURE) ×1
SUT VIC AB 0 CT1 27XCR 8 STRN (SUTURE) ×1 IMPLANT
SUT VIC AB 2-0 CT1 18 (SUTURE) ×2 IMPLANT
SYR 20ML LL LF (SYRINGE) ×2 IMPLANT
SYR 30ML LL (SYRINGE) ×4 IMPLANT
SYR 3ML LL SCALE MARK (SYRINGE) ×2 IMPLANT
TOWEL OR 17X26 4PK STRL BLUE (TOWEL DISPOSABLE) ×6 IMPLANT
TUBING CONNECTING 10 (TUBING) ×2 IMPLANT
WATER STERILE IRR 500ML POUR (IV SOLUTION) ×2 IMPLANT

## 2021-07-30 NOTE — Anesthesia Preprocedure Evaluation (Signed)
Anesthesia Evaluation  Patient identified by MRN, date of birth, ID band Patient awake    Reviewed: Allergy & Precautions, NPO status , Patient's Chart, lab work & pertinent test results  History of Anesthesia Complications Negative for: history of anesthetic complications  Airway Mallampati: II  TM Distance: >3 FB Neck ROM: Full    Dental no notable dental hx. (+) Teeth Intact   Pulmonary neg pulmonary ROS, neg sleep apnea, neg COPD, Patient abstained from smoking.Not current smoker, former smoker,    Pulmonary exam normal breath sounds clear to auscultation       Cardiovascular Exercise Tolerance: Good METShypertension, + CAD, + Past MI and + Cardiac Stents  (-) dysrhythmias  Rhythm:Regular Rate:Normal - Systolic murmurs    Neuro/Psych negative neurological ROS  negative psych ROS   GI/Hepatic neg GERD  ,(+)     (-) substance abuse  ,   Endo/Other  neg diabetes  Renal/GU Renal disease     Musculoskeletal   Abdominal   Peds  Hematology   Anesthesia Other Findings Past Medical History: No date: (HFpEF) heart failure with preserved ejection fraction (HCC) No date: BPH (benign prostatic hyperplasia) No date: Chronic anticoagulation     Comment:  a.) on DAPT therapy (ASA + clopidogrel) No date: Coronary artery disease     Comment:  a.) PCI 07/11/2001: 95% mLAD -->  2.5 x 18 mm Cordis BX               Velocity to mLAD. b.) PCI 11/25/2005: 50% ISR mLAD; 95%               pD2 --> 2.5 x 16 mm Taxus DES to pD2. c.) PCI 02/22/2007:              80% ISR mLAD --> 2.5 x 20 mm Taxus DES to mLAD. d.) PCI               09/19/2007: 60% ISR mLAD; FFR low at 0.73-0.75 --> 2.5 x               18 mm Xience DES to mLAD. No date: DDD (degenerative disc disease), lumbosacral No date: Elevated lipids No date: Hypertension No date: Left renal atrophy 07/12/2001: ST elevation myocardial infarction (STEMI) of anterior  wall  (HCC)     Comment:  a.) transferred to [facility]; PCI --> LVEF 53%; 95% mLAD -               2.5 x 18 mm Cordis BX Velocity stent placed. No date: Valvular regurgitation     Comment:  a.) TTE on 06/20/2021 --> mild TV, PV; trivial MV  Reproductive/Obstetrics                             Anesthesia Physical Anesthesia Plan  ASA: 2  Anesthesia Plan: General   Post-op Pain Management:    Induction: Intravenous  PONV Risk Score and Plan: 3 and Ondansetron, Dexamethasone and Midazolam  Airway Management Planned: Oral ETT  Additional Equipment: None  Intra-op Plan:   Post-operative Plan: Extubation in OR  Informed Consent: I have reviewed the patients History and Physical, chart, labs and discussed the procedure including the risks, benefits and alternatives for the proposed anesthesia with the patient or authorized representative who has indicated his/her understanding and acceptance.     Dental advisory given  Plan Discussed with: CRNA and Surgeon  Anesthesia Plan Comments: (Discussed risks of anesthesia with  patient, including PONV, sore throat, lip/dental damage. Rare risks discussed as well, such as cardiorespiratory and neurological sequelae, and allergic reactions. Patient understands.)        Anesthesia Quick Evaluation

## 2021-07-30 NOTE — Transfer of Care (Signed)
Immediate Anesthesia Transfer of Care Note  Patient: EDON HOADLEY  Procedure(s) Performed: OPEN L4-5 TRANSFORAMINAL LUMBAR INTERBODY FUSION (TLIF) 1 LEVEL BONE MARROW ASPIRATION FOR SPINE FUSION ONLY [facility])  Patient Location: PACU  Anesthesia Type:General  Level of Consciousness: drowsy  Airway & Oxygen Therapy: Patient Spontanous Breathing and Patient connected to face mask oxygen  Post-op Assessment: Report given to RN and Post -op Vital signs reviewed and stable  Post vital signs: Reviewed and stable  Last Vitals:  Vitals Value Taken Time  BP 103/70 07/30/21 1137  Temp    Pulse 67 07/30/21 1140  Resp 13 07/30/21 1140  SpO2 100 % 07/30/21 1140  Vitals shown include unvalidated device data.  Last Pain:  Vitals:   07/30/21 0718  TempSrc: Tympanic  PainSc: 3       Patients Stated Pain Goal: 2 (66/59/93 5701)  Complications: No notable events documented.

## 2021-07-30 NOTE — Anesthesia Postprocedure Evaluation (Signed)
Anesthesia Post Note  Patient: Luke Glenn  Procedure(s) Performed: OPEN L4-5 TRANSFORAMINAL LUMBAR INTERBODY FUSION (TLIF) 1 LEVEL BONE MARROW ASPIRATION FOR SPINE FUSION ONLY [facility])  Patient location during evaluation: PACU Anesthesia Type: General Level of consciousness: awake and alert Pain management: pain level controlled Vital Signs Assessment: post-procedure vital signs reviewed and stable Respiratory status: spontaneous breathing, nonlabored ventilation, respiratory function stable and patient connected to nasal cannula oxygen Cardiovascular status: blood pressure returned to baseline and stable Postop Assessment: no apparent nausea or vomiting Anesthetic complications: no   No notable events documented.   Last Vitals:  Vitals:   07/30/21 1200 07/30/21 1215  BP: 106/74 105/77  Pulse: 74 62  Resp: 14 13  Temp:    SpO2: 100% 97%    Last Pain:  Vitals:   07/30/21 1137  TempSrc:   PainSc: Asleep                 Arita Miss

## 2021-07-30 NOTE — Plan of Care (Signed)
  Problem: Activity: Goal: Risk for activity intolerance will decrease Outcome: Progressing   Problem: Pain Managment: Goal: General experience of comfort will improve Outcome: Progressing   Problem: Education: Goal: Ability to verbalize activity precautions or restrictions will improve Outcome: Progressing   Problem: Pain Management: Goal: Pain level will decrease Outcome: Progressing   Problem: Skin Integrity: Goal: Will show signs of wound healing Outcome: Progressing
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
## 2021-07-30 NOTE — Op Note (Signed)
Indications: [name] presented with anterolisthesis, pars defect, and chronic back pain with right sided sciatica.  He failed conservative management prompting surgical intervention.  Findings: partial correction of anterolisthesis  Preoperative Diagnosis: Anterolisthesis M43.10, Pars defect of lumbar spine M43.06, Chronic bilateral low back pain with right-sided sciatica M54.41, G89.29 Postoperative Diagnosis: same   EBL: 300 ml IVF: 1000 ml Drains: 1 placed Disposition: Extubated and Stable to PACU Complications: none  No foley catheter was placed.   Preoperative Note:   Risks of surgery discussed include: infection, bleeding, stroke, coma, death, paralysis, CSF leak, nerve/spinal cord injury, numbness, tingling, weakness, complex regional pain syndrome, recurrent stenosis and/or disc herniation, vascular injury, development of instability, neck/back pain, need for further surgery, persistent symptoms, development of deformity, and the risks of anesthesia. The patient understood these risks and agreed to proceed.  Operative Note:  1. Transforaminal Lumbar Interbody Fusion L4/5 2. Posterolateral arthrodesis L4 to L5 3. Posterior nonsegmental instrumentation L4 to L5 using Globus Creo 4. Harvesting of autograft via the same incision 5. Bone Marrow aspiration 6. Placement of a biomechanical device ([place]) at L4/5 for anterior arthrodesis    The patient was brought to the Operating Room, intubated and turned into the prone position. All pressure points were checked and double checked. Flouroscopy was used to mark the incision. The patient was prepped and draped in the standard fashion. A full timeout was performed. Preoperative antibiotics were given. The incision was injected with local anesthetic.  The incision was opened with a scalpel, then the soft tissues divided with the Bovie. Self-retaining retractors were placed. The paraspinus muscles were reflected laterally in  subperiosteal fashion until the transverse processes were visible. Flouroscopy was used to confirm our localization.  Using a Tuohy needle, 9ml of bone marrow was aspirated from the left iliac crest via a separate incision.  It was utilized for autograft later in the procedure.   The self-retaining retractors were repositioned. We then placed pedicle screws.  At L4 on one side, a starting point was chosen based on anatomic landmarks, then breached with a high speed drill. A pedicle finder probe was used to cannulate the pedicle, then the balltip probe used to confirm lack of breach. The tract was tapped, re-checked with the balltip probe, then a 6.5 x 50 mm pedicle screw was placed. The procedure was then repeated contralaterally and the same size screw placed. At L5 on one side, a starting point was chosen based on anatomic landmarks, then breached with a high speed drill. A pedicle finder probe was used to cannulate the pedicle, then the balltip probe used to confirm lack of breach. The tract was tapped, re-checked with the balltip probe, then a 6.5 x 50 mm pedicle screw was placed. The procedure was then repeated contralaterally and the same size screw placed.  After placement of pedicle screws, a screw-to-screw distractor was placed to distract the disc space. The pars defect was identified.  We then turned attention to performing the transforaminal decompression and interbody fusion. The right L4/5 facet was removed with osteotomes and the drill, and handed off for preparation as autograft. The traversing and exiting nerve roots on the right were identified and protected. The disc was opened using a scalpel. After incising the disc space, we took a combination of pituitary rongeurs, Kerrison rongeurs, disc scrapers, and curettes to remove a majority of the disc material.  We prepared the end plates for accepting the interbody fusion.  We removed the cartilaginous plate, preserved the  cortical endplate  if possible during this procedure.  We serially dilated up in order to increase the size of the disc space, while protecting the traversing and exiting nerve roots, until we had sized up to a trial.    The trial was removed, and the disc space packed with autograft and allograft. The Globus Sable TLIF biomechanical device was packed with a mixture of allograft and autograft and then inserted, with care taken to protect the nerve roots and thecal sac. After placement of the device, the screw-screw distractor was removed.   Rods were measured to length, cut, and shaped. The rods were secured using locking caps to manufacturer's specifications, with care to reduce much of the anterolisthesis. Final AP and lateral radiographs were taken to confirm placement of instrumentation and appropriate alignment. The wound was copiously irrigated, then the external surfaces of the remaining lamina, facet, and transverse processes from L4 to L5 were decorticated. A mixture of allograft and autograft was placed over the decorticated surfaces for arthrodesis.  A drain was placed subfascially.   After hemostasis, the wound was closed in layers with 0 and 2-0 vicryl.  Staples were used on the incision.   The patient was then flipped supine and extubated with incident. All counts were correct times 2 at the end of the case. No immediate complications were noted.  Cooper Render PA assisted in the entire procedure.

## 2021-07-30 NOTE — Anesthesia Procedure Notes (Signed)
Procedure Name: Intubation Date/Time: 07/30/2021 8:33 AM Performed by: Demetrius Charity, CRNA Pre-anesthesia Checklist: Patient identified, Patient being monitored, Timeout performed, Emergency Drugs available and Suction available Patient Re-evaluated:Patient Re-evaluated prior to induction Oxygen Delivery Method: Circle system utilized Preoxygenation: Pre-oxygenation with 100% oxygen Induction Type: IV induction Ventilation: Mask ventilation without difficulty Laryngoscope Size: McGraph and 4 Grade View: Grade I Tube type: Oral Tube size: 7.0 mm Number of attempts: 1 Airway Equipment and Method: Stylet and Video-laryngoscopy Placement Confirmation: ETT inserted through vocal cords under direct vision, positive ETCO2 and breath sounds checked- equal and bilateral Secured at: 22 cm Tube secured with: Tape Dental Injury: Teeth and Oropharynx as per pre-operative assessment

## 2021-07-31 ENCOUNTER — Inpatient Hospital Stay: Payer: 59

## 2021-07-31 LAB — CBC
HCT: 34.1 % — ABNORMAL LOW (ref 39.0–52.0)
Hemoglobin: 12.5 g/dL — ABNORMAL LOW (ref 13.0–17.0)
MCH: 34.7 pg — ABNORMAL HIGH (ref 26.0–34.0)
MCHC: 36.7 g/dL — ABNORMAL HIGH (ref 30.0–36.0)
MCV: 94.7 fL (ref 80.0–100.0)
Platelets: 145 10*3/uL — ABNORMAL LOW (ref 150–400)
RBC: 3.6 MIL/uL — ABNORMAL LOW (ref 4.22–5.81)
RDW: 12 % (ref 11.5–15.5)
WBC: 15.7 10*3/uL — ABNORMAL HIGH (ref 4.0–10.5)
nRBC: 0 % (ref 0.0–0.2)

## 2021-07-31 LAB — BASIC METABOLIC PANEL
Anion gap: 8 (ref 5–15)
BUN: 16 mg/dL (ref 8–23)
CO2: 22 mmol/L (ref 22–32)
Calcium: 8.3 mg/dL — ABNORMAL LOW (ref 8.9–10.3)
Chloride: 99 mmol/L (ref 98–111)
Creatinine, Ser: 0.77 mg/dL (ref 0.61–1.24)
GFR, Estimated: >60 mL/min (ref >60)
Glucose, Bld: 153 mg/dL — ABNORMAL HIGH (ref 70–99)
Potassium: 4.1 mmol/L (ref 3.5–5.1)
Sodium: 129 mmol/L — ABNORMAL LOW (ref 135–145)

## 2021-07-31 MED ORDER — CELECOXIB 100 MG PO CAPS
100.0000 mg | ORAL_CAPSULE | Freq: Two times a day (BID) | ORAL | Status: DC
  Administered 2021-07-31: 100 mg via ORAL
  Filled 2021-07-31 (×2): qty 1

## 2021-07-31 MED ORDER — SENNA 8.6 MG PO TABS
1.0000 | ORAL_TABLET | Freq: Two times a day (BID) | ORAL | 0 refills | Status: DC
Start: 2021-07-31 — End: 2022-06-25

## 2021-07-31 MED ORDER — METHOCARBAMOL 500 MG PO TABS: 500.0000 mg | ORAL_TABLET | Freq: Four times a day (QID) | ORAL | 0 refills | Status: DC

## 2021-07-31 MED ORDER — ASPIRIN EC 81 MG PO TBEC
81.0000 mg | DELAYED_RELEASE_TABLET | Freq: Every day | ORAL | Status: DC
  Administered 2021-07-31: 81 mg via ORAL
  Filled 2021-07-31: qty 1

## 2021-07-31 MED ORDER — ENOXAPARIN SODIUM 40 MG/0.4ML IJ SOSY
40.0000 mg | PREFILLED_SYRINGE | INTRAMUSCULAR | Status: DC
  Administered 2021-07-31: 40 mg via SUBCUTANEOUS
  Filled 2021-07-31: qty 0.4

## 2021-07-31 MED ORDER — OXYCODONE HCL 5 MG PO TABS: 5.0000 mg | ORAL_TABLET | Freq: Four times a day (QID) | ORAL | 0 refills | Status: AC | PRN

## 2021-07-31 NOTE — Progress Notes (Signed)
Patient discharged home via personal vehicle, IV removed. All belongings sent with patient. Discharge went over with patient and wife with opportunity to ask questions.

## 2021-07-31 NOTE — Progress Notes (Signed)
PT Cancellation Note  Patient Details Name: NICKLOS GAXIOLA MRN: [id number] DOB: Dec 05, 1960   Cancelled Treatment:    Reason Eval/Treat Not Completed: Patient at procedure or test/unavailable PT to reassess as able  [employer], SPT

## 2021-07-31 NOTE — Discharge Summary (Signed)
Physician Discharge Summary  Patient ID: Luke Glenn MRN: [id number] DOB/AGE: Sep 02, 1961 60 y.o.  Admit date: 07/30/2021 Discharge date: 07/31/2021  Admission Diagnoses: Lumbar radiculopathy and spondylolisthesis  Discharge Diagnoses:  Active Problems:   Lumbar radiculopathy   Discharged Condition: good  Hospital Course: Luke Glenn is a 60 y.o presenting with back and right leg pain.  He underwent a L4-5 transforaminal interbody fusion.  He was admitted overnight for observation.  On the evening of postop day 0 he felt a pop in his back when sitting up to get up to go to the bathroom.  It was not particularly painful and he denied any recurrent leg pain.  X-rays were obtained which did not show any concern for hardware complications.  His Hemovac drain output was monitored and discontinued when the output came down to an acceptable level.  He was seen by physical therapy and was ultimately considered appropriate for discharge home.  Prior to discharge she was able to ambulate, urinate, and tolerate p.o. intake.  He was discharged home with oral medications for pain as needed and instructions to follow-up for staple removal in about 2 weeks.  Consults: None  Significant Diagnostic Studies:   07/31/21 Lumbar xrays: L4-5 hardware intact without signs of rod fracture or screw pullout.  Treatments: surgery: as above  Discharge Exam: Blood pressure 108/68, pulse 80, temperature 98.7 F (37.1 C), resp. rate 18, height 5\' 9"  (1.753 m), weight 82.1 kg, SpO2 97 %.  CN II-XII grossly intact 5/5 strength in BLE Incision: intact with staples in place. No signs of infection  Disposition: Discharge disposition: 01-Home or Self Care      Discharge Instructions     Discharge wound care:   Complete by: As directed    See D/C instructions   Incentive spirometry RT   Complete by: As directed       Allergies as of 07/31/2021   No Known Allergies      Medication List     STOP  taking these medications    clopidogrel 75 MG tablet Commonly known as: PLAVIX   Fish Oil 1000 MG Caps       TAKE these medications    aspirin 81 MG tablet Take 81 mg by mouth daily.   azelastine 0.1 % nasal spray Commonly known as: ASTELIN PLACE 1 SPRAY INTO BOTH NOSTRILS 2 TIMES DAILY AS DIRECTED What changed:  how much to take how to take this when to take this reasons to take this   Biotin 5000 MCG Caps Take 5,000 mcg by mouth daily.   cetirizine 10 MG tablet Commonly known as: ZYRTEC Take 10 mg by mouth daily.   finasteride 5 MG tablet Commonly known as: PROSCAR TAKE 1 TABLET BY MOUTH DAILY. What changed: when to take this   lisinopril 20 MG tablet Commonly known as: ZESTRIL Take 1/2 tablet by mouth daily (Take 0.5 tablets (10 mg total) by mouth daily.) What changed: how much to take   Magnesium Oxide 250 MG Tabs Take 500 mg by mouth daily.   Melatonin 10 MG Tabs Take 10 mg by mouth at bedtime.   methocarbamol 500 MG tablet Commonly known as: ROBAXIN Take 1 tablet (500 mg total) by mouth every 6 (six) hours.   metoprolol succinate 25 MG 24 hr tablet Commonly known as: TOPROL-XL Take 25 mg by mouth daily.   niacin 500 MG tablet Take 1,500 mg by mouth daily.   oxyCODONE 5 MG immediate release tablet Commonly known as: Oxy  IR/ROXICODONE Take 1 tablet (5 mg total) by mouth every 6 (six) hours as needed for up to 5 days (pain).   pregabalin 75 MG capsule Commonly known as: LYRICA Take 1 capsule by mouth every night at bedtime for 4 days, then take 1 capsule by mouth two times daily   rosuvastatin 40 MG tablet Commonly known as: CRESTOR TAKE 1 TABLET BY MOUTH DAILY   senna 8.6 MG Tabs tablet Commonly known as: SENOKOT Take 1 tablet (8.6 mg total) by mouth 2 (two) times daily.   vitamin C 1000 MG tablet Take 2,000 mg by mouth daily.   Vitamin D3 50 MCG (2000 UT) Tabs Take 2,000 Units by mouth daily.               Discharge Care  Instructions  (From admission, onward)           Start     Ordered   07/31/21 0000  Discharge wound care:       Comments: See D/C instructions   07/31/21 1647            Follow-up Information     Loleta Dicker, PA Follow up in 2 week(s).   Why: For incision check, staple removal.  This appointment should already be scheduled.  Please call the [facility] with any questions regarding appointment date or time. Contact information: [place] [address] [phone]                 Signed: Loleta Dicker 07/31/2021, 4:48 PM

## 2021-07-31 NOTE — TOC Progression Note (Signed)
Transition of Care [facility]) - Progression Note    Patient Details  Name: Luke Glenn MRN: [id number] Date of Birth: [birth year]  Transition [facility] Care [facility]) CM/SW [place], RN Phone Number: 07/31/2021, 4:25 PM  Clinical Narrative:   Patient has spouse to assist, no concerns about returning home.  No HH as per surgical PA    Expected Discharge Plan: Home/Self Care Barriers to Discharge: Barriers Resolved  Expected Discharge Plan and Services Expected Discharge Plan: Home/Self Care     Post Acute Care Choice: NA Living arrangements for the past 2 months: Single Family Home                                   Representative spoke with at [place]: no HH needed as per surgical PA   Social Determinants of Health (SDOH) Interventions    Readmission Risk Interventions No flowsheet data found.

## 2021-07-31 NOTE — Discharge Instructions (Addendum)
NEUROSURGERY DISCHARGE INSTRUCTIONS  Admission diagnosis: Lumbar radiculopathy [M54.16]  Operative procedure: L4-5 TLIF   What to do after you leave the hospital:  Recommended diet: regular diet. Increase protein intake to promote wound healing.  Recommended activity: no lifting, driving, or strenuous exercise for 4 weeks . You should walk multiple times per day  Special Instructions  No straining, no heavy lifting > 10lbs x 4 weeks.  Keep incision area clean and dry. May shower in 2 days. No baths or pools for 6 weeks.  Please remove dressing tomorrow, no need to apply a bandage afterwards  You have staples that will be removed in clinic in 2 weeks.  Please take pain medications as directed. Take a stool softener if on pain medications  **You may resume your home Plavix and fish oil 14 days from the date of surgery as previously discussed.  Please Report any of the following: Nausea or Vomiting, Temperature is greater than 101.83F (38.1C) degrees, Dizziness, Abdominal Pain, Difficulty Breathing or Shortness of Breath, Inability to Eat, drink Fluids, or Take medications, Bleeding, swelling, or drainage from surgical incision sites, New numbness or weakness, and Bowel or bladder dysfunction to the neurosurgeon on call at [phone]  Additional Follow up appointments Please follow up with Cooper Render PA-C in [place] clinic as scheduled in 2-3 weeks

## 2021-07-31 NOTE — Plan of Care (Signed)
  Problem: Health Behavior/Discharge Planning: Goal: Ability to manage health-related needs will improve Outcome: Completed/Met   Problem: Clinical Measurements: Goal: Ability to maintain clinical measurements within normal limits will improve Outcome: Completed/Met Goal: Will remain free from infection Outcome: Completed/Met Goal: Respiratory complications will improve Outcome: Completed/Met   Problem: Activity: Goal: Risk for activity intolerance will decrease Outcome: Completed/Met   Problem: Nutrition: Goal: Adequate nutrition will be maintained Outcome: Completed/Met   Problem: Coping: Goal: Level of anxiety will decrease Outcome: Completed/Met   Problem: Elimination: Goal: Will not experience complications related to bowel motility Outcome: Completed/Met Goal: Will not experience complications related to urinary retention Outcome: Completed/Met   Problem: Pain Managment: Goal: General experience of comfort will improve Outcome: Completed/Met   Problem: Education: Goal: Ability to verbalize activity precautions or restrictions will improve Outcome: Completed/Met Goal: Understanding of discharge needs will improve Outcome: Completed/Met   Problem: Activity: Goal: Ability to avoid complications of mobility impairment will improve Outcome: Completed/Met Goal: Ability to tolerate increased activity will improve Outcome: Completed/Met Goal: Will remain free from falls Outcome: Completed/Met   Problem: Bowel/Gastric: Goal: Gastrointestinal status for postoperative course will improve Outcome: Completed/Met   Problem: Clinical Measurements: Goal: Ability to maintain clinical measurements within normal limits will improve Outcome: Completed/Met Goal: Postoperative complications will be avoided or minimized Outcome: Completed/Met Goal: Diagnostic test results will improve Outcome: Completed/Met   Problem: Pain Management: Goal: Pain level will  decrease Outcome: Completed/Met   Problem: Skin Integrity: Goal: Will show signs of wound healing Outcome: Completed/Met   Problem: Health Behavior/Discharge Planning: Goal: Identification of resources available to assist in meeting health care needs will improve Outcome: Completed/Met   Problem: Bladder/Genitourinary: Goal: Urinary functional status for postoperative course will improve Outcome: Completed/Met

## 2021-07-31 NOTE — Progress Notes (Signed)
    Attending Progress Note  History: Luke Glenn is s/p L4-5 TLIF   POD#1: reports improvement in right leg pain. Had a "pop" in his back last night when getting up to go to the bathroom. Denies any severe back pain as a result of this but is worried about his hardware.   Physical Exam: Vitals:   07/31/21 0428 07/31/21 0751  BP: 97/61 118/65  Pulse: 64 70  Resp: 16 17  Temp: 98 F (36.7 C) 98.1 F (36.7 C)  SpO2: 98% 96%    AA Ox3 CNI  Strength:5/5 throughout BLE HV output:0 since surgery Incision: with post-op dressing in place.  Data:  Recent Labs  Lab 07/31/21 0355  NA 129*  K 4.1  CL 99  CO2 22  BUN 16  CREATININE 0.77  GLUCOSE 153*  CALCIUM 8.3*   No results for input(s): AST, ALT, ALKPHOS in the last 168 hours.  Invalid input(s): TBILI   Recent Labs  Lab 07/31/21 0355  WBC 15.7*  HGB 12.5*  HCT 34.1*  PLT 145*   No results for input(s): APTT, INR in the last 168 hours.       Other tests/results: lumbar xrays pending   Assessment/Plan:  Luke Glenn is a 60 y.o male presenting with lumbar spondylolisthesis and pars defects resulting in lumbar radiculopathy.  He is status post L4-5 TLIF.  - mobilize - pain control - DVT prophylaxis;Lovenox to start today - will obtain xrays to further evaluate hardware. - HV output 0 since surgery. Will continue to monitor after PT today.  - PT  Cooper Render PA-C Department of Neurosurgery

## 2021-07-31 NOTE — Evaluation (Signed)
Physical Therapy Evaluation Patient Details Name: Luke Glenn MRN: [id number] DOB: Apr 20, 1961 Today's Date: 07/31/2021  History of Present Illness  Pt is a 60 yo male s/p open L4-L5 TLIF. PMH of CAD, HTN, renal disease, PCI.  Clinical Impression  Patient A&Ox4, family at bedside. Pt and family had many questions and concerns involving mobility, transfers. Time spent on education as well as performance of tasks to ensure pt/family comfort and confidence. A baseline pt reported he is independent.     Pt instructed in log roll technique with supervision. Sit <> stand with supervision and RW for patient comfort. He was able to ambulate ~161ft with supervision/CGA, as well as perform stair training with supervision and at least one rail. Pt also demonstrated low commode transfer and car transfer discussed.  Overall the patient demonstrated deficits (see "PT Problem List") that impede the patient's functional abilities, safety, and mobility and would benefit from skilled PT intervention. Recommendation is to follow surgeon's recommendations for follow up therapies.      Recommendations for follow up therapy are one component of a multi-disciplinary discharge planning process, led by the attending physician.  Recommendations may be updated based on patient status, additional functional criteria and insurance authorization.  Follow Up Recommendations Follow surgeon's recommendation for DC plan and follow-up therapies    Equipment Recommendations  None recommended by PT    Recommendations for Other Services       Precautions / Restrictions Precautions Precautions: Fall;Back Precaution Booklet Issued: Yes (comment) Required Braces or Orthoses: Spinal Brace Spinal Brace: Applied in sitting position Restrictions Weight Bearing Restrictions: No      Mobility  Bed Mobility Overal bed mobility: Needs Assistance Bed Mobility: Rolling;Sidelying to Sit Rolling: Supervision         General  bed mobility comments: cued for log rolling technique    Transfers Overall transfer level: Modified independent Equipment used: Rolling walker (2 wheeled)             General transfer comment: initally cued for hand placement, great carryover noted  Ambulation/Gait   Gait Distance (Feet): 180 Feet Assistive device: Rolling walker (2 wheeled)       General Gait Details: instructed in BLTs and reminders needed to maintain occasionally  Stairs Stairs: Yes Stairs assistance: Supervision Stair Management: One rail Right;One rail Left;Alternating pattern Number of Stairs: 6 General stair comments: steady, safe, a little impuslive. no unsteadiness noted  Wheelchair Mobility    Modified Rankin (Stroke Patients Only)       Balance Overall balance assessment: Needs assistance Sitting-balance support: Feet supported Sitting balance-Leahy Scale: Normal Sitting balance - Comments: educated on donning underwear   Standing balance support: During functional activity Standing balance-Leahy Scale: Normal Standing balance comment: pt more comfortable with RW use                             Pertinent Vitals/Pain Pain Assessment: 0-10 Pain Score: 5  Pain Location: R sided back pain Pain Descriptors / Indicators: Sore Pain Intervention(s): Limited activity within patient's tolerance;Monitored during session;Repositioned    Home Living Family/patient expects to be discharged to:: Private residence Living Arrangements: Spouse/significant other Available Help at Discharge: Family;Available 24 hours/day Type of Home: House Home Access: Stairs to enter Entrance Stairs-Rails: Psychiatric nurse of Steps: 4 Home Layout: Two level;Able to live on main level with bedroom/bathroom Home Equipment: Walker - 2 wheels      Prior Function Level of Independence: Independent  Comments: no falls     Hand Dominance        Extremity/Trunk  Assessment   Upper Extremity Assessment Upper Extremity Assessment: Overall WFL for tasks assessed    Lower Extremity Assessment Lower Extremity Assessment: Overall WFL for tasks assessed (sensation intact bilaterally)    Cervical / Trunk Assessment Cervical / Trunk Assessment: Normal  Communication   Communication: No difficulties  Cognition Arousal/Alertness: Awake/alert Behavior During Therapy: WFL for tasks assessed/performed Overall Cognitive Status: Within Functional Limits for tasks assessed                                        General Comments      Exercises     Assessment/Plan    PT Assessment Patient needs continued PT services  PT Problem List Decreased strength;Decreased mobility;Decreased activity tolerance;Decreased balance;Pain       PT Treatment Interventions DME instruction;Therapeutic exercise;Gait training;Balance training;Stair training;Neuromuscular re-education;Functional mobility training;Therapeutic activities;Patient/family education    PT Goals (Current goals can be found in the Care Plan section)  Acute Rehab PT Goals Patient Stated Goal: to return to PLOF PT Goal Formulation: With patient Time For Goal Achievement: 08/14/21 Potential to Achieve Goals: Good    Frequency 7X/week   Barriers to discharge        Co-evaluation               AM-PAC PT "6 Clicks" Mobility  Outcome Measure Help needed turning from your back to your side while in a flat bed without using bedrails?: None Help needed moving from lying on your back to sitting on the side of a flat bed without using bedrails?: None Help needed moving to and from a bed to a chair (including a wheelchair)?: None Help needed standing up from a chair using your arms (e.g., wheelchair or bedside chair)?: None Help needed to walk in hospital room?: None Help needed climbing 3-5 steps with a railing? : None 6 Click Score: 24    End of Session Equipment  Utilized During Treatment: Gait belt Activity Tolerance: Patient tolerated treatment well Patient left: in chair;with call bell/phone within reach;with family/visitor present Nurse Communication: Mobility status PT Visit Diagnosis: Other abnormalities of gait and mobility (R26.89);Difficulty in walking, not elsewhere classified (R26.2);Muscle weakness (generalized) (M62.81);Pain Pain - Right/Left:  (midline and R sided) Pain - part of body:  (lumbago)    Time: 1125-1207 PT Time Calculation (min) (ACUTE ONLY): 42 min   Charges:   PT Evaluation $PT Eval Low Complexity: 1 Low PT Treatments $Gait Training: 8-22 mins $Therapeutic Exercise: 8-22 mins $Therapeutic Activity: 8-22 mins        Lieutenant Diego PT, DPT 1:07 PM,07/31/21

## 2021-08-19 ENCOUNTER — Ambulatory Visit (INDEPENDENT_AMBULATORY_CARE_PROVIDER_SITE_OTHER): Payer: 59 | Admitting: Urology

## 2021-08-19 ENCOUNTER — Encounter: Payer: Self-pay | Admitting: Urology

## 2021-08-19 ENCOUNTER — Other Ambulatory Visit
Admission: RE | Admit: 2021-08-19 | Discharge: 2021-08-19 | Disposition: A | Discharge status: Home / Self Care | Payer: 59 | Attending: Urology | Admitting: Urology

## 2021-08-19 ENCOUNTER — Other Ambulatory Visit: Payer: Self-pay | Admitting: *Deleted

## 2021-08-19 ENCOUNTER — Other Ambulatory Visit: Payer: Self-pay

## 2021-08-19 VITALS — BP 117/71 | HR 73 | Ht 68.0 in | Wt 188.0 lb

## 2021-08-19 DIAGNOSIS — Z8052 Family history of malignant neoplasm of bladder: Secondary | ICD-10-CM | POA: Diagnosis not present

## 2021-08-19 DIAGNOSIS — N401 Enlarged prostate with lower urinary tract symptoms: Secondary | ICD-10-CM

## 2021-08-19 DIAGNOSIS — R351 Nocturia: Secondary | ICD-10-CM | POA: Diagnosis present

## 2021-08-19 LAB — URINALYSIS, COMPLETE (UACMP) WITH MICROSCOPIC
Bilirubin Urine: NEGATIVE
Glucose, UA: NEGATIVE mg/dL
Hgb urine dipstick: NEGATIVE
Ketones, ur: NEGATIVE mg/dL
Leukocytes,Ua: NEGATIVE
Nitrite: NEGATIVE
Protein, ur: NEGATIVE mg/dL
Specific Gravity, Urine: 1.015 (ref 1.005–1.030)
pH: 6 (ref 5.0–8.0)

## 2021-08-19 LAB — BLADDER SCAN AMB NON-IMAGING

## 2021-08-19 NOTE — Patient Instructions (Addendum)
Minimize fluids 3 to 4 hours before bed, and urinate twice before going to sleep.  Avoid diet drinks in the afternoon and evenings, as these can contribute to getting up multiple times overnight.  You can dilute your diet Powerade to half-strength which can help with the overnight urination.  Consider getting a sleep apnea study, as this also can contribute to overnight urination

## 2021-08-19 NOTE — Progress Notes (Signed)
08/19/21 10:32 AM   Luke Glenn 19-Jul-1961 [id number]  CC: Urinary symptoms, nocturia, family history of bladder cancer  HPI: I saw Luke Glenn in clinic for the above issues.  I previously saw him in September 2019 for possible left renal atrophy, and this was chronic with imaging similar to previous in 2011 and no left-sided pain, and normal renal function.  He reports a few months of worsening urinary symptoms primarily with nocturia 2-3 times overnight.  He is drinking 2 bottles of diet Powerade in the evenings to prevent leg cramps overnight.  He previously was a big Diet Coke drinker but has cut back.  He has never been evaluated for sleep apnea.  He has a family history of bladder cancer in his father.  He has a 10-pack-year smoking history and quit in 2002.  His PCP recently started him on finasteride 5 mg for his worsening urinary symptoms which was increased from the 1 mg dose that he was taking previously for hair loss.  He denies significant urinary symptoms during the day aside from some occasional urgency or frequency.  Denies weak stream or gross hematuria.  IPSS score today is 8.  Urinalysis today completely benign, PVR 119 mL.  PSA in 2019 was 0.07.  PMH: Past Medical History:  Diagnosis Date   (HFpEF) heart failure with preserved ejection fraction (HCC)    BPH (benign prostatic hyperplasia)    Chronic anticoagulation    a.) on DAPT therapy (ASA + clopidogrel)   Coronary artery disease    a.) PCI 07/11/2001: 95% mLAD -->  2.5 x 18 mm Cordis BX Velocity to mLAD. b.) PCI 11/25/2005: 50% ISR mLAD; 95% pD2 --> 2.5 x 16 mm Taxus DES to pD2. c.) PCI 02/22/2007: 80% ISR mLAD --> 2.5 x 20 mm Taxus DES to mLAD. d.) PCI 09/19/2007: 60% ISR mLAD; FFR low at 0.73-0.75 --> 2.5 x 18 mm Xience DES to mLAD.   DDD (degenerative disc disease), lumbosacral    Elevated lipids    Hypertension    Left renal atrophy    ST elevation myocardial infarction (STEMI) of anterior wall ([place])  07/12/2001   a.) transferred to [facility]; PCI --> LVEF 53%; 95% mLAD - 2.5 x 18 mm Cordis BX Velocity stent placed.   Valvular regurgitation    a.) TTE on 06/20/2021 --> mild TV, PV; trivial MV    Surgical History: Past Surgical History:  Procedure Laterality Date   COLONOSCOPY WITH ESOPHAGOGASTRODUODENOSCOPY (EGD)     COLONOSCOPY WITH PROPOFOL N/A 12/25/2015   Procedure: COLONOSCOPY WITH PROPOFOL;  Surgeon: Manya Silvas, MD;  Location: [place];  Service: Endoscopy;  Laterality: N/A;   CORONARY ANGIOPLASTY WITH STENT PLACEMENT Left 07/12/2001   Procedure: LEFT HEART CATHETERIZATION; PCI WITH STENT PLACEMENT (2.5 x 18 mm Cordis BX Velocity stent x 1 to mLAD); Location: [facility]; Surgeon: Pura Spice, MD   CORONARY ANGIOPLASTY WITH STENT PLACEMENT Left 02/22/2007   Procedure: LEFT HEART CATHETERIZATION; PCI WITH STENT PLACEMENT (2.5 x 20 mm Taxus DES to mLAD); Location: [facility]; Surgeon: Lovena Neighbours, MD   CORONARY ANGIOPLASTY WITH STENT PLACEMENT Left 09/19/2007   Procedure: LEFT HEART CATHETERIZATION; PCI WITH STENT PLACEMENT (2.5 x 18 mm Xience DES to mLAD); Location: [facility]; Surgeon: Joeseph Amor, MD   CORONARY ANGIOPLASTY WITH STENT PLACEMENT Left 11/25/2005   Procedure: LEFT HEART CATHETERIZATION; PCI WITH STENT PLACEMENT (2.5 x 16 mm Taxus DES to pD2); Location: [facility]; Surgeon: ???, MD   MAXIMUM ACCESS (MAS) TRANSFORAMINAL LUMBAR INTERBODY FUSION (  TLIF) 1 LEVEL N/A 07/30/2021   Procedure: OPEN L4-5 TRANSFORAMINAL LUMBAR INTERBODY FUSION (TLIF) 1 LEVEL;  Surgeon: Meade Maw, MD;  Location: [facility];  Service: Neurosurgery;  Laterality: N/A;   MENISCECTOMY Right 02/08/1995   Procedure: MEDIAL MENISCECTOMY; Location: [facility]; Surgeon: Kelli Hope, MD      Family History: Family History  Problem Relation Age of Onset   Dementia Mother    Diabetes Father    Heart attack Father    Bladder Cancer Father    Stroke Maternal Grandmother    Heart attack Maternal Grandmother     Cancer Maternal Grandfather        unknown type   Gout Maternal Grandfather    Diabetes Paternal Grandmother    Heart attack Paternal Grandfather    Kidney cancer Neg Hx    Prostate cancer Neg Hx     Social History:  reports that he quit smoking about 20 years ago. His smoking use included cigars and cigarettes. He has a 10.00 pack-year smoking history. He has never used smokeless tobacco. He reports current alcohol use. He reports that he does not use drugs.  Physical Exam: BP 117/71   Pulse 73   Ht 5\' 8"  (1.727 m)   Wt 188 lb (85.3 kg)   BMI 28.59 kg/m    Constitutional:  Alert and oriented, No acute distress. Cardiovascular: No clubbing, cyanosis, or edema. Respiratory: Normal respiratory effort, no increased work of breathing. GI: Abdomen is soft, nontender, nondistended, no abdominal masses DRE: 50 g, smooth, no nodules or masses  Laboratory Data: Reviewed, see HPI  Assessment & Plan:   59 year old male with mild urinary symptoms of nocturia 2-3 times overnight.  We discussed possible etiologies at length, and this may be related to his high intake of diet Powerade's in the evening to prevent leg cramps, or potentially sleep apnea.  We discussed strategies including decreasing his fluids before bedtime, diluting the Powerade, and I recommended a sleep study evaluation for sleep apnea.  We reviewed behavioral strategies extensively.  We discussed the impact of finasteride on BPH, and that it can take 6 to 9 months for this medication to make a difference.  RTC 1 year IPSS, PVR, symptom check   Nickolas Madrid, MD 08/19/2021  [facility] [address], [place], [place] [address] [phone]

## 2021-08-21 ENCOUNTER — Other Ambulatory Visit: Payer: Self-pay

## 2021-08-22 ENCOUNTER — Other Ambulatory Visit: Payer: Self-pay

## 2021-08-22 ENCOUNTER — Ambulatory Visit: Payer: 59 | Attending: Internal Medicine

## 2021-08-22 DIAGNOSIS — Z23 Encounter for immunization: Secondary | ICD-10-CM

## 2021-08-22 MED ORDER — FLUARIX QUADRIVALENT 0.5 ML IM SUSY
PREFILLED_SYRINGE | INTRAMUSCULAR | 0 refills | Status: DC
  Filled 2021-08-22: qty 0.5, 1d supply, fill #0

## 2021-08-22 MED ORDER — MODERNA COVID-19 BIVAL BOOSTER 50 MCG/0.5ML IM SUSP
INTRAMUSCULAR | 0 refills | Status: DC
  Filled 2021-08-22: qty 0.5, 1d supply, fill #0

## 2021-08-22 NOTE — Progress Notes (Signed)
   Covid-19 Vaccination Clinic  Name:  Luke Glenn    MRN: [id number] DOB: 1961/04/16  08/22/2021  [name] was observed post Covid-19 immunization for 15 minutes without incident. He was provided with Vaccine Information Sheet and instruction to access the V-Safe system.   [name] was instructed to call 911 with any severe reactions post vaccine: Difficulty breathing  Swelling of face and throat  A fast heartbeat  A bad rash all over body  Dizziness and weakness   Lu Duffel, PharmD, MBA Clinical Acute Care Pharmacist

## 2021-08-27 ENCOUNTER — Ambulatory Visit: Payer: Self-pay | Admitting: Urology

## 2021-09-09 ENCOUNTER — Other Ambulatory Visit: Payer: Self-pay

## 2021-09-09 MED ORDER — PREGABALIN 75 MG PO CAPS
ORAL_CAPSULE | ORAL | 5 refills | Status: DC
  Filled 2021-09-09: qty 60, 30d supply, fill #0
  Filled 2021-10-30: qty 60, 30d supply, fill #1

## 2021-09-18 ENCOUNTER — Other Ambulatory Visit: Payer: Self-pay

## 2021-10-06 ENCOUNTER — Other Ambulatory Visit: Payer: Self-pay | Admitting: Family Medicine

## 2021-10-06 ENCOUNTER — Other Ambulatory Visit: Payer: Self-pay

## 2021-10-06 DIAGNOSIS — I10 Essential (primary) hypertension: Secondary | ICD-10-CM

## 2021-10-06 DIAGNOSIS — I2510 Atherosclerotic heart disease of native coronary artery without angina pectoris: Secondary | ICD-10-CM

## 2021-10-07 MED ORDER — CLOPIDOGREL BISULFATE 75 MG PO TABS
ORAL_TABLET | Freq: Every day | ORAL | 0 refills | Status: DC
Start: 2021-10-07 — End: 2022-09-23
  Filled 2021-10-07: qty 90, 90d supply, fill #0

## 2021-10-07 MED ORDER — FLUTICASONE PROPIONATE 50 MCG/ACT NA SUSP
2.0000 | Freq: Every day | NASAL | 0 refills | Status: DC
Start: 2021-10-07 — End: 2021-10-30
  Filled 2021-10-07: qty 16, 30d supply, fill #0

## 2021-10-07 MED ORDER — METOPROLOL SUCCINATE ER 25 MG PO TB24
25.0000 mg | ORAL_TABLET | Freq: Every day | ORAL | 0 refills | Status: DC
  Filled 2021-10-07: qty 90, 90d supply, fill #0

## 2021-10-07 NOTE — Telephone Encounter (Signed)
Called patient but could not leave a message. Called Mrs Galentine and spoke with her and asked to have patient call us back to see if he is requesting refills on this or it was an automatic request.

## 2021-10-07 NOTE — Telephone Encounter (Signed)
Spoke with patient-patient does want Flonase refilled. Plavix he is still on the medication and he stopped for a few days prior to back surgery on 07/30/21 but is back on it and his cardiologist with [facility] (notes in epic) wanted him to continue taking this medication. Metoprolol patient states he has been taking 25 mg but the reason refill request is sent over for 100 mg is because he was breaking it up. Patient states he would like to have 100 mg refilled so he can get the most for the money but if not able to be done he was ok with that. Patient also states he gets cold sores and has an old RX for [employer] -generic- 500 mg 1 tablet by mouth as directed #90 that was given to him but he is not sure who prescribed it-did not see this in the historical medications for the patient. He has an active cold sore on his lip and tongue and would like to get this filled.

## 2021-10-07 NOTE — Telephone Encounter (Signed)
Will refill metoprolol succinate as prescribed at 25 mg daily. Will refill Flonase.  Reviewed cardiology notes from August 2022, will refill clopidogrel 75 mg.  Patient will need a virtual or office visit for his cold sore complaint.

## 2021-10-08 ENCOUNTER — Other Ambulatory Visit: Payer: Self-pay

## 2021-10-08 MED ORDER — LISINOPRIL 20 MG PO TABS
20.0000 mg | ORAL_TABLET | Freq: Every day | ORAL | 3 refills | Status: DC
  Filled 2021-10-08: qty 90, 90d supply, fill #0

## 2021-10-08 NOTE — Telephone Encounter (Signed)
Called patient and Luke Glenn

## 2021-10-08 NOTE — Telephone Encounter (Signed)
Patient will need a virtual or office visit for his cold sore complaint. Please call and schedule with any available provider.   Thank you.

## 2021-10-09 NOTE — Telephone Encounter (Signed)
2nd attempt LMTCB to schedule appt

## 2021-10-24 ENCOUNTER — Other Ambulatory Visit: Payer: Self-pay | Admitting: Orthopedic Surgery

## 2021-10-24 DIAGNOSIS — M1711 Unilateral primary osteoarthritis, right knee: Secondary | ICD-10-CM

## 2021-10-24 DIAGNOSIS — M2391 Unspecified internal derangement of right knee: Secondary | ICD-10-CM

## 2021-10-29 ENCOUNTER — Other Ambulatory Visit: Payer: Self-pay

## 2021-10-30 ENCOUNTER — Other Ambulatory Visit: Payer: Self-pay

## 2021-10-30 ENCOUNTER — Other Ambulatory Visit: Payer: Self-pay | Admitting: Family Medicine

## 2021-10-30 ENCOUNTER — Other Ambulatory Visit: Payer: Self-pay | Admitting: Primary Care

## 2021-10-30 MED ORDER — FLUTICASONE PROPIONATE 50 MCG/ACT NA SUSP
2.0000 | Freq: Every day | NASAL | 0 refills | Status: DC
  Filled 2021-10-30: qty 48, 90d supply, fill #0

## 2021-10-31 ENCOUNTER — Other Ambulatory Visit: Payer: Self-pay

## 2021-10-31 MED ORDER — AZELASTINE HCL 0.1 % NA SOLN
NASAL | 0 refills | Status: DC
  Filled 2021-10-31: qty 30, 30d supply, fill #0

## 2021-11-01 ENCOUNTER — Ambulatory Visit
Admission: RE | Admit: 2021-11-01 | Discharge: 2021-11-01 | Disposition: A | Discharge status: Home / Self Care | Payer: 59 | Source: Ambulatory Visit | Attending: Orthopedic Surgery | Admitting: Orthopedic Surgery

## 2021-11-01 ENCOUNTER — Other Ambulatory Visit: Payer: Self-pay

## 2021-11-01 DIAGNOSIS — M2391 Unspecified internal derangement of right knee: Secondary | ICD-10-CM

## 2021-11-01 DIAGNOSIS — M1711 Unilateral primary osteoarthritis, right knee: Secondary | ICD-10-CM | POA: Insufficient documentation

## 2021-11-01 IMAGING — MR MR KNEE*R* W/O CM
7 series · 40 of 40 positions shown · non-contrast
Comparison: None.

CLINICAL DATA: Chronic right knee pain

EXAM:
MRI OF THE RIGHT KNEE WITHOUT CONTRAST
TECHNIQUE: Multiplanar, multisequence MR imaging of the knee was performed. No
intravenous contrast was administered.

[Series 22: T2 fat-sat · axial · right · 4.0mm · 0.50mm/px · z∈[-81,+43]mm · 5 of 26 slices shown (1 of 3)]
[im 1/26]
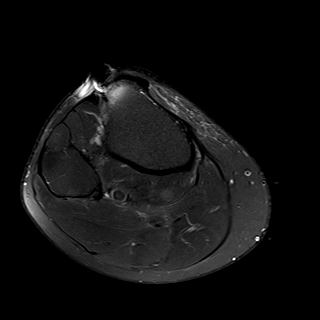
[im 7/26]
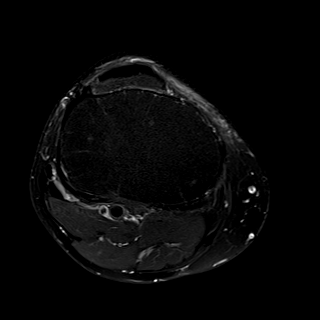
[im 13/26]
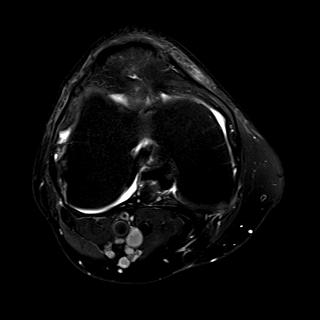
[im 19/26]
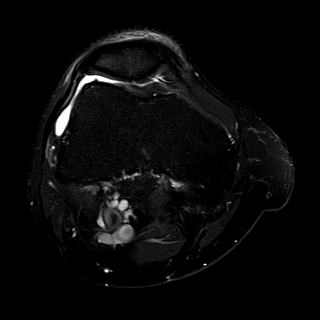
[im 26/26]
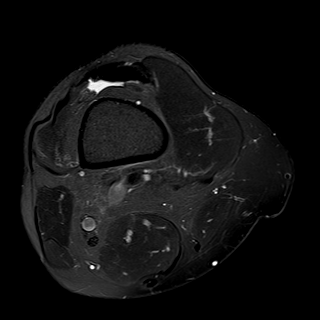

[Series 23: T2 fat-sat · coronal · right · 4.0mm · 0.59mm/px · 6 of 28 slices shown (2 of 3)]
[im 1/28]
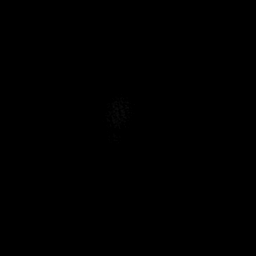
[im 6/28]
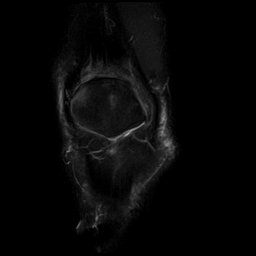
[im 11/28]
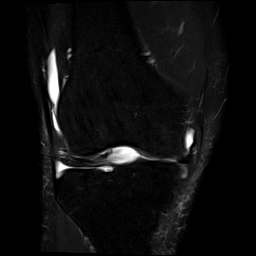
[im 17/28]
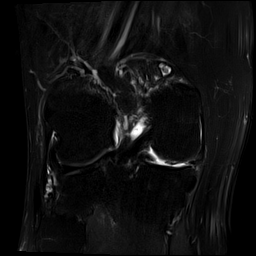
[im 22/28]
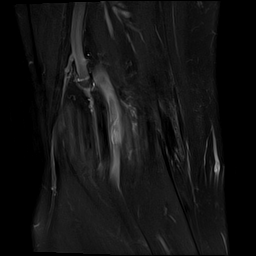
[im 28/28]
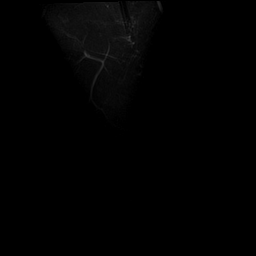

[Series 24: T1 · coronal · right · 4.0mm · 0.59mm/px · 6 of 28 slices shown]
[im 1/28]
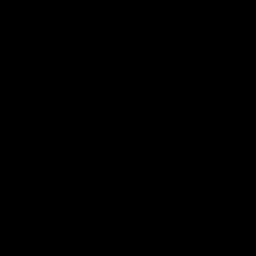
[im 6/28]
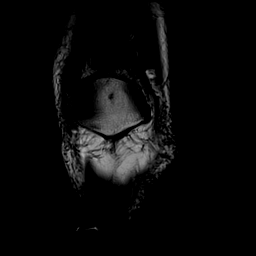
[im 11/28]
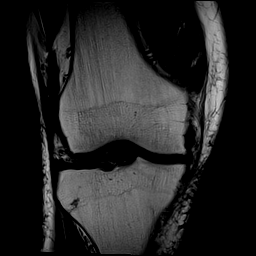
[im 17/28]
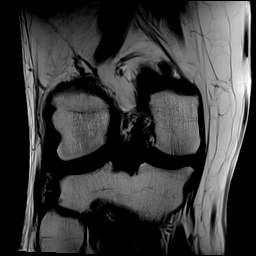
[im 22/28]
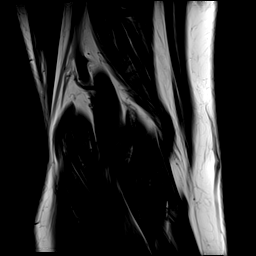
[im 28/28]
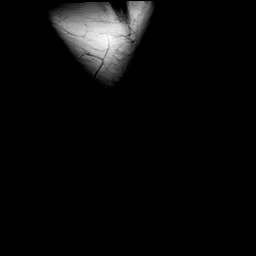

[Series 25: PD fat-sat · coronal · right · 4.0mm · 0.59mm/px · 6 of 28 slices shown (1 of 2)]
[im 1/28]
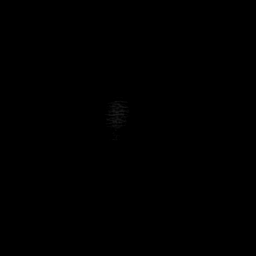
[im 6/28]
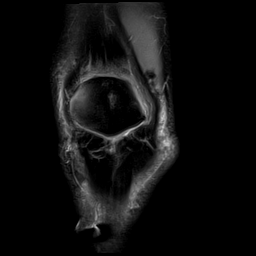
[im 11/28]
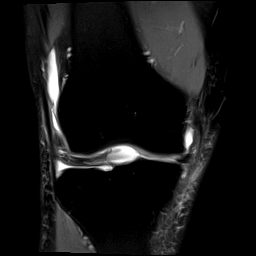
[im 17/28]
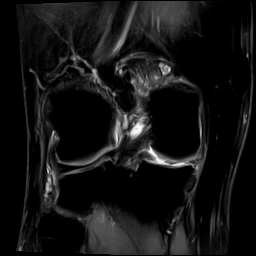
[im 22/28]
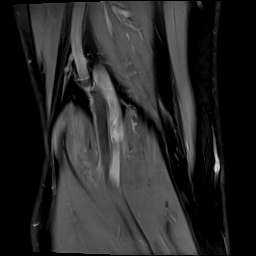
[im 28/28]
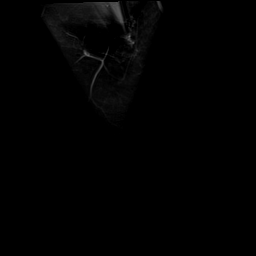

[Series 26: PD fat-sat · sagittal · right · 3.0mm · 0.59mm/px · 7 of 35 slices shown (2 of 2)]
[im 1/35]
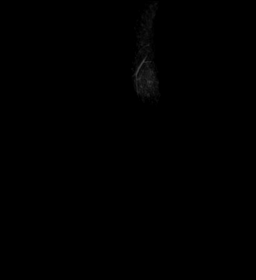
[im 6/35]
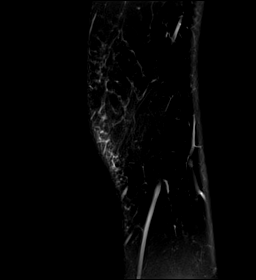
[im 12/35]
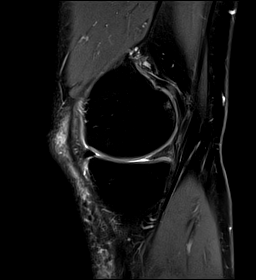
[im 18/35]
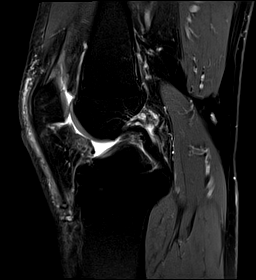
[im 23/35]
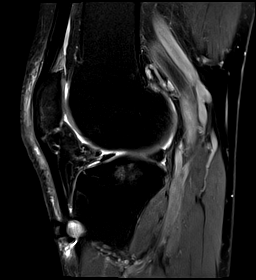
[im 29/35]
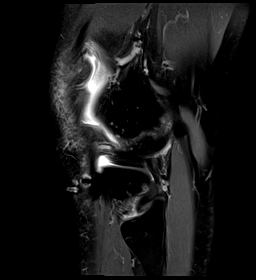
[im 35/35]
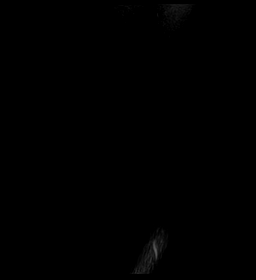

[Series 27: T2 fat-sat · sagittal · right · 3.0mm · 0.59mm/px · 7 of 36 slices shown (3 of 3)]
[im 1/36]
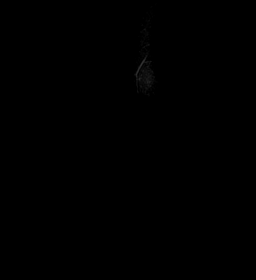
[im 6/36]
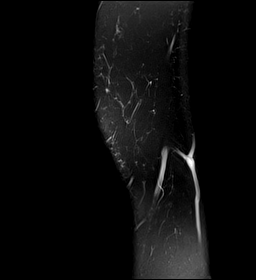
[im 12/36]
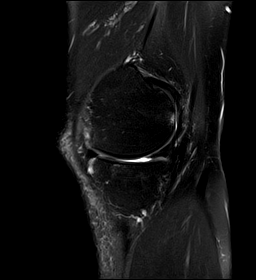
[im 18/36]
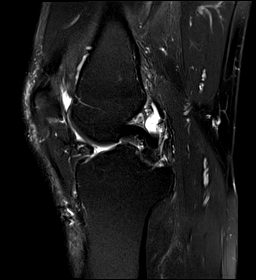
[im 24/36]
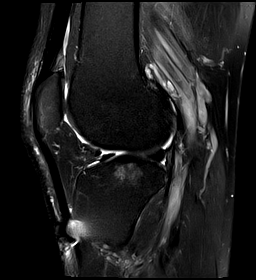
[im 30/36]
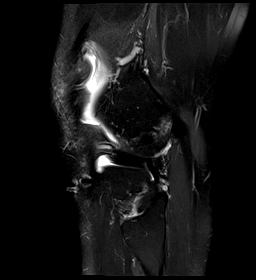
[im 36/36]
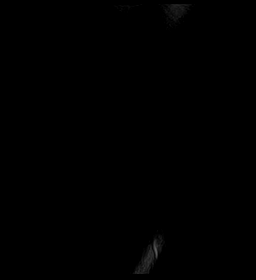

[Series 28: PD · oblique · right · 2.0mm · 0.47mm/px · 3 of 16 slices shown]
[im 1/16]
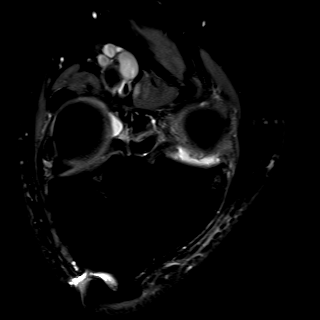
[im 8/16]
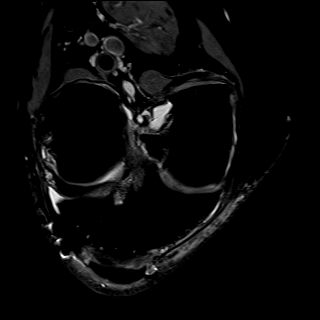
[im 16/16]
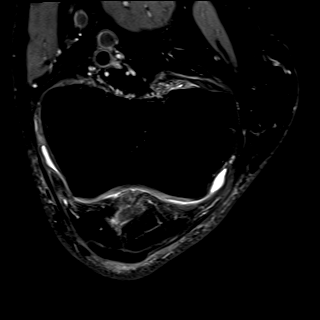

[40 of 40 positions shown; findings below may reference images not displayed]

FINDINGS: MENISCI

Medial meniscus: Medial meniscal degeneration with partial free edge
tearing of the posterior horn and body.

Lateral meniscus:  Intact.

LIGAMENTS

Cruciates: Intact ACL and PCL.

Collaterals: Intact MCL. Lateral collateral ligament complex intact.

CARTILAGE

Patellofemoral: Mild chondral thinning of the lateral patellar facet
and partial-thickness surface irregularity within the trochlea.

Medial: Moderate diffuse cartilage thinning and surface irregularity
involving the weight-bearing and posterior nonweightbearing surfaces
of the medial compartment.

Lateral:  No chondral defect.

MISCELLANEOUS

Joint: Small joint effusion. Suprapatellar fat pad is slightly
edematous. Mild edema throughout Hoffa's fat.

Popliteal Fossa:  No Baker's cyst. Intact popliteus tendon.

Extensor Mechanism: Intact quadriceps and patellar tendons. Mild
proximal patellar tendinosis. Medial compartment joint

Bones: Space narrowing with small marginal osteophytes. Mild
reactive subchondral marrow signal changes are most pronounced at
the periphery of the medial tibial plateau. No acute fracture. No
dislocation. No bone marrow edema. No marrow replacing bone lesion.

Other: Focus of susceptibility artifact within the anterior
subcutaneous soft tissues near the tibial tuberosity, likely
representing a foreign body. No significant periarticular soft
tissue findings.
IMPRESSION: 1. Moderate medial and mild patellofemoral compartment
osteoarthritis.
2. Medial meniscal degeneration with partial free edge tearing of
the posterior horn and body.
3. Mild proximal patellar tendinosis.
4. Small joint effusion.
5. Suspected foreign body within the soft tissues anterior to the
tibial tuberosity.

## 2021-12-08 NOTE — Discharge Instructions (Addendum)
Instructions after Knee Arthroscopy    James P. Hooten, Jr., M.D.     Dept. of Orthopaedics & Sports Medicine  [facility]  [address]  [place], [place]  [address]   Phone: [phone]   Fax: [phone]   DIET: Drink plenty of non-alcoholic fluids & begin a light diet. Resume your normal diet the day after surgery.  ACTIVITY:  You may use crutches or a walker with weight-bearing as tolerated, unless instructed otherwise. You may wean yourself off of the walker or crutches as tolerated.  Begin doing gentle exercises. Exercising will reduce the pain and swelling, increase motion, and prevent muscle weakness.   Avoid strenuous activities or athletics for a minimum of 4-6 weeks after arthroscopic surgery. Do not drive or operate any equipment until instructed.  WOUND CARE:  Place one to two pillows under the knee the first day or two when sitting or lying.  Continue to use the ice packs periodically to reduce pain and swelling. The small incisions in your knee are closed with nylon stitches. The stitches will be removed in the office. The bulky dressing may be removed on the second day after surgery. DO NOT TOUCH THE STITCHES. Put a Band-Aid over each stitch. Do NOT use any ointments or creams on the incisions.  You may bathe or shower after the stitches are removed at the first office visit following surgery.  MEDICATIONS: You may resume your regular medications. Please take the pain medication as prescribed. Do not take pain medication on an empty stomach. Do not drive or drink alcoholic beverages when taking pain medications.  CALL THE OFFICE FOR: Temperature above 101 degrees Excessive bleeding or drainage on the dressing. Excessive swelling, coldness, or paleness of the toes. Persistent nausea and vomiting.  FOLLOW-UP:  You should have an appointment to return to the office in 7-10 days after surgery.      [facility] Department Directory          [email or web address]       [email or web address]          Cardiology  Appointments: [place] - [phone] [place] - [phone]  Endocrinology  Appointments: [place] - [phone] [place] - [phone]  Gastroenterology  Appointments: [employer] - [phone] [place] - [phone]        General Surgery   Appointments: [place] - [phone]  Internal Medicine/Family Medicine  Appointments: [place] - [phone] [place] - [phone] [place] - [phone]  Metabolic and Weigh Loss Surgery  Appointments: [place] - [phone]        Neurology  Appointments: [place] - [phone] [place] - [phone]  Neurosurgery  Appointments: [place] - [phone]  Obstetrics & Gynecology  Appointments: [place] - [phone] [place] - [phone]        Pediatrics  Appointments: [place] - [phone] [place] - [phone]  Physiatry  Appointments: [place] -[phone]  Physical Therapy  Appointments: [place] - [phone] [place] - [phone]        Podiatry  Appointments: [place] - [phone] [place] - [phone]  Pulmonology  Appointments: [place] - [phone]  Rheumatology  Appointments: [place] - [phone]        [place] Location: [facility]  [address] , [place]  [address]  [place] Location: [facility] [address] [place], [place]  [address]  [place] Location: [facility] [address] [place], [place]  [address]      AMBULATORY SURGERY  DISCHARGE INSTRUCTIONS   The drugs that you were given will stay in your system until tomorrow so for the next 24   hours you should not:  Drive an automobile Make any legal decisions Drink any alcoholic beverage   You may resume regular meals tomorrow.  Today it is better to start with liquids and gradually work up to solid foods.  You may eat anything you prefer, but it is better to  start with liquids, then soup and crackers, and gradually work up to solid foods.   Please notify your doctor immediately if you have any unusual bleeding, trouble breathing, redness and pain at the surgery site, drainage, fever, or pain not relieved by medication.    Additional Instructions:   Please contact your physician with any problems or Same Day Surgery at [phone], Monday through Friday 6 am to 4 pm, or [place] [facility] number at [phone]. 

## 2021-12-12 ENCOUNTER — Other Ambulatory Visit: Payer: Self-pay

## 2021-12-12 ENCOUNTER — Encounter
Admission: RE | Admit: 2021-12-12 | Discharge: 2021-12-12 | Disposition: A | Discharge status: Home / Self Care | Payer: Managed Care, Other (non HMO) | Source: Ambulatory Visit | Attending: Orthopedic Surgery | Admitting: Orthopedic Surgery

## 2021-12-12 NOTE — Patient Instructions (Signed)
Your procedure is scheduled on:12-22-21 Monday Report to the Registration Desk on the 1st floor of the [facility].Then proceed to the 2nd floor Surgery Desk in the [facility] To find out your arrival time, please call [phone] between 1PM - 3PM on:12-19-21 Friday  REMEMBER: Instructions that are not followed completely may result in serious medical risk, up to and including death; or upon the discretion of your surgeon and anesthesiologist your surgery may need to be rescheduled.  Do not eat food after midnight the night before surgery.  No gum chewing, lozengers or hard candies.  You may however, drink CLEAR liquids up to 2 hours before you are scheduled to arrive for your surgery. Do not drink anything within 2 hours of your scheduled arrival time.  Clear liquids include: - water  - apple juice without pulp - gatorade (not RED, PURPLE, OR BLUE) - black coffee or tea (Do NOT add milk or creamers to the coffee or tea) Do NOT drink anything that is not on this list.  In addition, your doctor has ordered for you to drink the provided  Ensure Pre-Surgery Clear Carbohydrate Drink  Drinking this carbohydrate drink up to two hours before surgery helps to reduce insulin resistance and improve patient outcomes. Please complete drinking 2 hours prior to scheduled arrival time.  TAKE THESE MEDICATIONS THE MORNING OF SURGERY WITH A SIP OF WATER: -cetirizine (ZYRTEC) -metoprolol succinate (TOPROL-XL) -rosuvastatin (CRESTOR)  Stop clopidogrel (PLAVIX) 5 days prior to surgery per Dr Mallie Mussel dose on 12-16-21 Tuesday  Continue your aspirin 81 MG tablet up until the day prior to surgery per Dr Mammie Lorenzo NOT take the day of surgery  One week prior to surgery: Stop Anti-inflammatories (NSAIDS) such as Advil, Aleve, Ibuprofen, Motrin, Naproxen, Naprosyn and Aspirin based products such as Excedrin, Goodys Powder, BC Powder.You may however, take Tylenol if needed for pain up until the day of  surgery.  Stop ANY OVER THE COUNTER supplements/vitamins 7 days prior to surgery (VITAMIN C, Biotin , VITAMIN D3, Magnesium Oxide, GLUCOSAMINE CHOND MSM FORMULA, FISH OIL, vitamin B-12)-You may continue your Melatonin up until the day prior to surgery  No Alcohol for 24 hours before or after surgery.  No Smoking including e-cigarettes for 24 hours prior to surgery.  No chewable tobacco products for at least 6 hours prior to surgery.  No nicotine patches on the day of surgery.  Do not use any "recreational" drugs for at least a week prior to your surgery.  Please be advised that the combination of cocaine and anesthesia may have negative outcomes, up to and including death. If you test positive for cocaine, your surgery will be cancelled.  On the morning of surgery brush your teeth with toothpaste and water, you may rinse your mouth with mouthwash if you wish. Do not swallow any toothpaste or mouthwash.  Use CHG Soap as directed on instruction sheet.  Do not wear jewelry, make-up, hairpins, clips or nail polish.  Do not wear lotions, powders, or perfumes.   Do not shave body from the neck down 48 hours prior to surgery just in case you cut yourself which could leave a site for infection.  Also, freshly shaved skin may become irritated if using the CHG soap.  Contact lenses, hearing aids and dentures may not be worn into surgery.  Do not bring valuables to the hospital. [facility] is not responsible for any missing/lost belongings or valuables.   Notify your doctor if there is any change in your  medical condition (cold, fever, infection).  Wear comfortable clothing (specific to your surgery type) to the hospital.  After surgery, you can help prevent lung complications by doing breathing exercises.  Take deep breaths and cough every 1-2 hours. Your doctor may order a device called an Incentive Spirometer to help you take deep breaths. When coughing or sneezing, hold a pillow firmly  against your incision with both hands. This is called splinting. Doing this helps protect your incision. It also decreases belly discomfort.  If you are being admitted to the hospital overnight, leave your suitcase in the car. After surgery it may be brought to your room.  If you are being discharged the day of surgery, you will not be allowed to drive home. You will need a responsible adult (18 years or older) to drive you home and stay with you that night.   If you are taking public transportation, you will need to have a responsible adult (18 years or older) with you. Please confirm with your physician that it is acceptable to use public transportation.   Please call the [facility] Dept. at [phone] if you have any questions about these instructions.  Surgery Visitation Policy:  Patients undergoing a surgery or procedure may have one family member or support person with them as long as that person is not COVID-19 positive or experiencing its symptoms.  That person may remain in the waiting area during the procedure and may rotate out with other people.  Inpatient Visitation:    Visiting hours are 7 a.m. to 8 p.m. Up to two visitors ages 16+ are allowed at one time in a patient room. The visitors may rotate out with other people during the day. Visitors must check out when they leave, or other visitors will not be allowed. One designated support person may remain overnight. The visitor must pass COVID-19 screenings, use hand sanitizer when entering and exiting the patients room and wear a mask at all times, including in the patients room. Patients must also wear a mask when staff or their visitor are in the room. Masking is required regardless of vaccination status.

## 2021-12-15 ENCOUNTER — Encounter: Payer: Self-pay | Admitting: Orthopedic Surgery

## 2021-12-15 NOTE — Progress Notes (Signed)
Perioperative Services  Pre-Admission/Anesthesia Testing Clinical Review  Date: 12/18/21  Patient Demographics:  Name: Luke Glenn DOB:   10-11-1961 MRN:   [id number]  Planned Surgical Procedure(s):    Case: [id number] Date/Time: 12/22/21 1531   Procedure: ARTHROSCOPY KNEE (Right: Knee)   Anesthesia type: Choice   Pre-op diagnosis: INTERNAL DERANGEMENT OF RIGHT KNEE.   Location: [facility] OR ROOM 01 / [place] [facility] FOR ANESTHESIA GROUP   Surgeons: Dereck Leep, MD   NOTE: Available PAT nursing documentation and vital signs have been reviewed. Clinical nursing staff has updated patient's PMH/PSHx, current medication list, and drug allergies/intolerances to ensure comprehensive history available to assist in medical decision making as it pertains to the aforementioned surgical procedure and anticipated anesthetic course. Extensive review of available clinical information performed. [place] PMH and PSHx updated with any diagnoses/procedures that  may have been inadvertently omitted during his intake with the pre-admission testing department's nursing staff.  Clinical Discussion:  Luke Glenn is a 61 y.o. male who is submitted for pre-surgical anesthesia review and clearance prior to him undergoing the above procedure. Patient is a Former Smoker (10 pack years; quit 11/2000). Pertinent PMH includes: CAD (s/p multiple PCIs), anterior STEMI, HFpEF, HTN, HLD, lumbosacral DDD, BPH.   Patient is followed by cardiology Loyal Buba, MD). He was last seen in the cardiology clinic on 06/20/2021; notes reviewed. At the time of his clinic visit, the patient denied any chest pain, shortness of breath, PND, orthopnea, palpitations, significant peripheral edema, vertiginous symptoms, or presyncope/syncope.  Patient with a PMH significant for cardiovascular diagnoses.  Patient suffered an anterior wall STEMI on 07/12/2001.  He was transferred from [facility] to [facility] where he underwent PCI that revealed significant  single-vessel CAD.  LVEF was 53%.  There was a 95% stenosis of the mid LAD, which was treated with placement of a 2.5 x 18 mm Cordis BX Velocity stent.  Diagnostic left heart catheterization performed on 11/25/2005.  LVEF was normal with mild anterior hypokinesis.  LVEDP 13 mmHg.  CAD noted, 25% ostial left main, 25% proximal LCx, 50% ISR of the LAD, 25% proximal D1, 95% proximal D2, and 25% RCA.  PCI was performed and a 2.5 x 16 mm Taxus DES x 1 placed to the proximal D2.  Diagnostic left heart catheterization repeated on 02/22/2007 revealing 20% stenosis of the LM, 25% stenosis of the proximal LCx, and 30% of the RCA.  Previously placed stent to the proximal D2 without evidence of ISR.  There was 90% ISR within the previously placed mid LAD stent, which was treated by placement of a 2.5 x 20 mm Taxus DES x 1.  Patient underwent diagnostic left heart catheterization on 09/19/2007 revealing an LVEF of 55%.  There was single-vessel CAD noted with 60% occlusion of the mid LAD.  FFR was performed and found to be low at 0.73-0.75.  PCI performed and a 2.5 x 18 Xience DES x 1 placed.  Most recent TTE performed on 06/20/2021 revealed mild left ventricular systolic function with an EF of 50%.  There was normal LA pressures with normal diastolic function.  There was trivial to mild mitral, pulmonary, and tricuspid valve regurgitation.  No evidence of valvular stenosis.  Left ventricular septal wall hypokinetic.  Patient on daily DAPT therapy (ASA + clopidogrel); compliant with therapy with no evidence of GI bleeding.  Blood pressure well controlled at 92/60 on currently prescribed ACEi and beta-blocker therapies.  Patient is on a statin for his HLD.  He is  not diabetic.  Overall functional capacity limited by back pain, however patient still felt to be able to achieve at least 4 METS of activity without angina/anginal equivalent symptoms.  No changes were made to patient's medication regimen.  Patient to follow-up  with outpatient cardiology in 1 year or sooner if needed.  Luke Glenn is scheduled for an elective RIGHT KNEE ARTHROSCOPY on 12/22/2021 with Dr. Skip Estimable, MD.  Given patient's past medical history significant for cardiovascular diagnoses, presurgical cardiac clearance was sought by the PAT team. Per cardiology, "this patient is optimized for surgery and may proceed with the planned procedural course with a LOW risk of significant perioperative cardiovascular complications".  Again, this patient is on daily DAPT therapy.  Patient has been advised on recommendations from his cardiologist for holding his clopidogrel for 7 days prior to his procedure with plans to restart as soon as postoperative bleeding risk felt to be minimized by his primary attending surgeon. The patient is aware that his last dose of clopidogrel should be on 12/14/2021. He has been asked to continue his daily low dose ASA throughout the perioperative course.   Patient denies previous perioperative complications with anesthesia in the past.  In review of his EMR, it is noted the patient underwent a general anesthetic course here (ASA II) and 07/2021 with no documented complications.  Vitals with BMI 08/19/2021 07/31/2021 07/31/2021  Height _0  - -  Weight 188 lbs - -  BMI 93.26 - -  Systolic [phone]  Diastolic 71 77 68  Pulse 73 65 80    Providers/Specialists:   NOTE: Primary physician provider listed below. Patient may have been seen by APP or partner within same practice.   PROVIDER ROLE / SPECIALTY LAST OV  Hooten, Laurice Record, MD Orthopedics (Surgeon) 11/25/2021  Leonel Ramsay, MD Primary Care Provider 11/17/2021  Lovena Neighbours, MD  Cardiology 06/20/2021   Allergies:  Patient has no known allergies.  Current Home Medications:   No current facility-administered medications for this encounter.    Ascorbic Acid (VITAMIN C) 1000 MG tablet   aspirin 81 MG tablet   azelastine (ASTELIN) 0.1 % nasal spray    Biotin 5000 MCG CAPS   cetirizine (ZYRTEC) 10 MG tablet   Cholecalciferol (VITAMIN D3) 50 MCG (2000 UT) TABS   clopidogrel (PLAVIX) 75 MG tablet   finasteride (PROSCAR) 5 MG tablet   fluticasone (FLONASE) 50 MCG/ACT nasal spray   losartan (COZAAR) 50 MG tablet   Magnesium Oxide 250 MG TABS   Melatonin 10 MG TABS   metoprolol succinate (TOPROL-XL) 25 MG 24 hr tablet   Misc Natural Products (GLUCOSAMINE CHOND MSM FORMULA PO)   niacin 500 MG tablet   Omega-3 Fatty Acids (FISH OIL) 1000 MG CAPS   rosuvastatin (CRESTOR) 40 MG tablet   vitamin B-12 (CYANOCOBALAMIN) 1000 MCG tablet   COVID-19 mRNA bivalent vaccine, Moderna, (MODERNA COVID-19 BIVAL BOOSTER) 50 MCG/0.5ML injection   ibuprofen (ADVIL) 200 MG tablet   influenza vac split quadrivalent PF (FLUARIX QUADRIVALENT) 0.5 ML injection   lisinopril (ZESTRIL) 20 MG tablet   lisinopril (ZESTRIL) 20 MG tablet   methocarbamol (ROBAXIN) 500 MG tablet   pregabalin (LYRICA) 75 MG capsule   senna (SENOKOT) 8.6 MG TABS tablet   History:   Past Medical History:  Diagnosis Date   (HFpEF) heart failure with preserved ejection fraction (HCC)    a.) TTE 10/14/2017: EF 45%; mild global dysfunction; triv AR/PR, mild MR/TR. b.) TTE 06/20/2021: EF 50%; GLS -  16.1%; mild LV dysfunction; septal HL; triv MR, mild TR/PR.   BPH (benign prostatic hyperplasia)    Chronic anticoagulation    a.) on DAPT therapy (ASA + clopidogrel)   Coronary artery disease    a.) PCI 07/11/2001: 95% mLAD -->  2.5 x 18 mm Cordis BX Velocity to mLAD. b.) PCI 11/25/2005: 50% ISR mLAD; 95% pD2 --> 2.5 x 16 mm Taxus DES to pD2. c.) PCI 02/22/2007: 80% ISR mLAD --> 2.5 x 20 mm Taxus DES to mLAD. d.) PCI 09/19/2007: 60% ISR mLAD; FFR low at 0.73-0.75 --> 2.5 x 18 mm Xience DES to mLAD.   DDD (degenerative disc disease), lumbosacral    Elevated lipids    History of heart artery stent    a.) total of 4 stents: 2.5 x 18 mm Cordis BX Velocity mLAD (07/2001), 2.5 x 16 mm Taxus pD2  (11/2005), 2.5 x 20 mm Taxus mLAD (02/2007), 2.5 x 18 mm Xience DES mLAD (09/2007).   Hypertension    Left renal atrophy    ST elevation myocardial infarction (STEMI) of anterior wall ([place]) 07/12/2001   a.) transferred to [facility]; PCI --> LVEF 53%; 95% mLAD - 2.5 x 18 mm Cordis BX Velocity stent placed.   Valvular regurgitation    a.) TTE on 06/20/2021 --> mild TR, PR; trivial MR   Past Surgical History:  Procedure Laterality Date   COLONOSCOPY WITH ESOPHAGOGASTRODUODENOSCOPY (EGD)     COLONOSCOPY WITH PROPOFOL N/A 12/25/2015   Procedure: COLONOSCOPY WITH PROPOFOL;  Surgeon: Manya Silvas, MD;  Location: [place];  Service: Endoscopy;  Laterality: N/A;   CORONARY ANGIOPLASTY WITH STENT PLACEMENT Left 07/12/2001   Procedure: LEFT HEART CATHETERIZATION; PCI WITH STENT PLACEMENT (2.5 x 18 mm Cordis BX Velocity stent x 1 to mLAD); Location: [facility]; Surgeon: Pura Spice, MD   CORONARY ANGIOPLASTY WITH STENT PLACEMENT Left 02/22/2007   Procedure: LEFT HEART CATHETERIZATION; PCI WITH STENT PLACEMENT (2.5 x 20 mm Taxus DES to mLAD); Location: [facility]; Surgeon: Lovena Neighbours, MD   CORONARY ANGIOPLASTY WITH STENT PLACEMENT Left 09/19/2007   Procedure: LEFT HEART CATHETERIZATION; PCI WITH STENT PLACEMENT (2.5 x 18 mm Xience DES to mLAD); Location: [facility]; Surgeon: Joeseph Amor, MD   CORONARY ANGIOPLASTY WITH STENT PLACEMENT Left 11/25/2005   Procedure: LEFT HEART CATHETERIZATION; PCI WITH STENT PLACEMENT (2.5 x 16 mm Taxus DES to pD2); Location: [facility]; Surgeon: ???, MD   MAXIMUM ACCESS (MAS) TRANSFORAMINAL LUMBAR INTERBODY FUSION (TLIF) 1 LEVEL N/A 07/30/2021   Procedure: OPEN L4-5 TRANSFORAMINAL LUMBAR INTERBODY FUSION (TLIF) 1 LEVEL;  Surgeon: Meade Maw, MD;  Location: [facility] ORS;  Service: Neurosurgery;  Laterality: N/A;   MENISCECTOMY Right 02/08/1995   Procedure: MEDIAL MENISCECTOMY; Location: [facility]; Surgeon: Kelli Hope, MD   Family History  Problem Relation Age of Onset   Dementia  Mother    Diabetes Father    Heart attack Father    Bladder Cancer Father    Stroke Maternal Grandmother    Heart attack Maternal Grandmother    Cancer Maternal Grandfather        unknown type   Gout Maternal Grandfather    Diabetes Paternal Grandmother    Heart attack Paternal Grandfather    Kidney cancer Neg Hx    Prostate cancer Neg Hx    Social History   Tobacco Use   Smoking status: Former    Packs/day: 0.50    Years: 20.00    Pack years: 10.00    Types: Cigars, Cigarettes    Quit date: 2002  Years since quitting: 21.1   Smokeless tobacco: Never  Vaping Use   Vaping Use: Never used  Substance Use Topics   Alcohol use: Yes    Comment: 1-2 beers less than 3 times a week   Drug use: No    Pertinent Clinical Results:  LABS: Labs reviewed: Acceptable for surgery.   Ref Range & Units 11/17/2021  WBC (White Blood Cell Count) 4.1 - 10.2 103/uL 4.5   RBC (Red Blood Cell Count) 4.69 - 6.13 106/uL 4.95   Hemoglobin 14.1 - 18.1 gm/dL 15.9   Hematocrit 40.0 - 52.0 % 44.8   MCV (Mean Corpuscular Volume) 80.0 - 100.0 fl 90.5   MCH (Mean Corpuscular Hemoglobin) 27.0 - 31.2 pg 32.1 High    MCHC (Mean Corpuscular Hemoglobin Concentration) 32.0 - 36.0 gm/dL 35.5   Platelet Count 150 - 450 103/uL 185   RDW-CV (Red Cell Distribution Width) 11.6 - 14.8 % 12.7   MPV (Mean Platelet Volume) 9.4 - 12.4 fl 9.4   Neutrophils 1.50 - 7.80 103/uL 3.03   Lymphocytes 1.00 - 3.60 103/uL 0.80 Low    Monocytes 0.00 - 1.50 103/uL 0.65   Eosinophils 0.00 - 0.55 103/uL 0.03   Basophils 0.00 - 0.09 103/uL 0.02   Neutrophil % 32.0 - 70.0 % 66.8   Lymphocyte % 10.0 - 50.0 % 17.6   Monocyte % 4.0 - 13.0 % 14.3 High    Eosinophil % 1.0 - 5.0 % 0.7 Low    Basophil% 0.0 - 2.0 % 0.4   Immature Granulocyte % <=0.7 % 0.2   Immature Granulocyte Count <=0.06 10^3/L 0.01   Resulting Agency  [facility]  Specimen Collected: 11/17/21 09:49 Last Resulted: 11/17/21 10:10  Received  From: [facility]  Result Received: 12/05/21 15:16    Ref Range & Units 11/17/2021  Glucose 70 - 110 mg/dL 78   Sodium 136 - 145 mmol/L 132 Low    Potassium 3.6 - 5.1 mmol/L 4.9   Chloride 97 - 109 mmol/L 98   Carbon Dioxide (CO2) 22.0 - 32.0 mmol/L 31.3   Urea Nitrogen (BUN) 7 - 25 mg/dL 11   Creatinine 0.7 - 1.3 mg/dL 0.7   Glomerular Filtration Rate (eGFR), MDRD Estimate >60 mL/min/1.73sq m 115   Calcium 8.7 - 10.3 mg/dL 9.4   AST  8 - 39 U/L 32   ALT  6 - 57 U/L 30   Alk Phos (alkaline Phosphatase) 34 - 104 U/L 80   Albumin 3.5 - 4.8 g/dL 4.7   Bilirubin, Total 0.3 - 1.2 mg/dL 1.0   Protein, Total 6.1 - 7.9 g/dL 7.0   A/G Ratio 1.0 - 5.0 gm/dL 2.0   Resulting Agency  [facility]  Specimen Collected: 11/17/21 09:49 Last Resulted: 11/17/21 11:57  Received From: [facility]  Result Received: 12/05/21 15:16    ECG: Date: 06/20/2021 Rate: 54 bpm Rhythm:  Sinus rhythm Intervals: PR 54 ms. QRS 84 ms. QTc 384 ms. ST segment and T wave changes: ST elevation likely due to early repolarization  Comparison: Similar to previous tracing obtained on 10/15/2017 NOTE: Tracing obtained at [facility]; unable for review. Above based on cardiologist's interpretation.    IMAGING / PROCEDURES: MRI KNEE RIGHT WO CONTRAST performed on 11/01/2021 Moderate medial and mild patellofemoral compartment osteoarthritis. Medial meniscal degeneration with partial free edge tearing of the posterior horn and body. Mild proximal patellar tendinosis. Small joint effusion. Suspected foreign body within the soft  tissues anterior to the tibial tuberosity.   TRANSTHORACIC ECHOCARDIOGRAM performed on 06/20/2021 Mild left ventricular systolic dysfunction with an EF of 50% Left ventricular GLS -16.1% Normal LA pressures with normal diastolic function Normal right ventricular systolic function Trivial MR Mild TR and PR No AR No valvular  stenosis Hypokinetic left ventricular septal wall Mild right ventricular and atrial enlargement No evidence of pericardial effusion  LEFT HEART CATHETERIZATION AND CORONARY ANGIOGRAPHY performed on 09/19/2007 LVEF 55% 60% ISR of the previously placed stents to the mid LAD  FFR low at 0.73-0.75 PCI performed and a 2.5 x 18 mm Xience DES x 1 placed to the mid LAD   LEFT HEART CATHETERIZATION AND CORONARY ANGIOGRAPHY performed on 02/22/2007 LVEF 55% 80% ISR of the previously placed stent to the mid LAD PCI performed and a 2.5 x 20 mm Taxus DES placed to the mid LAD  LEFT HEART CATHETERIZATION AND CORONARY ANGIOGRAPHY performed on 11/25/2005 Normal left ventricular systolic function with mild anterior hypokinesis LVEDP 13 Multivessel CAD with significant disease in single vessel 25% ostial LM 25% proximal LCx 50% ISR of previously placed mid LAD stent 25% proximal D1 95% proximal D2 25% RCA PCI performed and a 2.5 x 16 mm Taxus DES was placed to the proximal D2.   LEFT HEART CATHETERIZATION AND CORONARY ANGIOGRAPHY performed on 07/12/2001 LVEF 53% 95% stenosis mid LAD PCI performed and 2.5 x 18 mm Cordis BX Velocity stent placed to mid LAD.   Impression and Plan:  Luke Glenn has been referred for pre-anesthesia review and clearance prior to him undergoing the planned anesthetic and procedural courses. Available labs, pertinent testing, and imaging results were personally reviewed by me. This patient has been appropriately cleared by cardiology with an overall LOW risk of significant perioperative cardiovascular complications.  Based on clinical review performed today (12/18/21), barring any significant acute changes in the patient's overall condition, it is anticipated that he will be able to proceed with the planned surgical intervention. Any acute changes in clinical condition may necessitate his procedure being postponed and/or cancelled. Patient will meet with anesthesia team  (MD and/or CRNA) on the day of his procedure for preoperative evaluation/assessment. Questions regarding anesthetic course will be fielded at that time.   Pre-surgical instructions were reviewed with the patient during his PAT appointment  and questions were fielded by PAT clinical staff. Patient was advised that if any questions or concerns arise prior to his procedure then he should return a call to PAT and/or his surgeon's office to discuss.  Honor Loh, MSN, APRN, FNP-C, CEN [facility]  Peri-operative Services Nurse Practitioner Phone: [phone] Fax: [phone] 12/18/21 8:35 AM  NOTE: This note has been prepared using Dragon dictation software. Despite my best ability to proofread, there is always the potential that unintentional transcriptional errors may still occur from this process.
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
## 2021-12-22 ENCOUNTER — Encounter: Payer: Self-pay | Admitting: Orthopedic Surgery

## 2021-12-22 ENCOUNTER — Other Ambulatory Visit: Payer: Self-pay

## 2021-12-22 ENCOUNTER — Encounter
Admission: RE | Disposition: A | Discharge status: Home / Self Care | Payer: Self-pay | Source: Home / Self Care | Attending: Orthopedic Surgery

## 2021-12-22 ENCOUNTER — Ambulatory Visit: Payer: Managed Care, Other (non HMO) | Admitting: Urgent Care

## 2021-12-22 ENCOUNTER — Ambulatory Visit
Admission: RE | Admit: 2021-12-22 | Discharge: 2021-12-22 | Disposition: A | Discharge status: Home / Self Care | Payer: Managed Care, Other (non HMO) | Attending: Orthopedic Surgery | Admitting: Orthopedic Surgery

## 2021-12-22 DIAGNOSIS — X58XXXA Exposure to other specified factors, initial encounter: Secondary | ICD-10-CM | POA: Insufficient documentation

## 2021-12-22 DIAGNOSIS — N400 Enlarged prostate without lower urinary tract symptoms: Secondary | ICD-10-CM | POA: Diagnosis not present

## 2021-12-22 DIAGNOSIS — M94261 Chondromalacia, right knee: Secondary | ICD-10-CM | POA: Diagnosis not present

## 2021-12-22 DIAGNOSIS — M25461 Effusion, right knee: Secondary | ICD-10-CM | POA: Insufficient documentation

## 2021-12-22 DIAGNOSIS — Z87891 Personal history of nicotine dependence: Secondary | ICD-10-CM | POA: Insufficient documentation

## 2021-12-22 DIAGNOSIS — M1711 Unilateral primary osteoarthritis, right knee: Secondary | ICD-10-CM | POA: Diagnosis not present

## 2021-12-22 DIAGNOSIS — Z955 Presence of coronary angioplasty implant and graft: Secondary | ICD-10-CM | POA: Insufficient documentation

## 2021-12-22 DIAGNOSIS — M5137 Other intervertebral disc degeneration, lumbosacral region: Secondary | ICD-10-CM | POA: Diagnosis not present

## 2021-12-22 DIAGNOSIS — I252 Old myocardial infarction: Secondary | ICD-10-CM | POA: Insufficient documentation

## 2021-12-22 DIAGNOSIS — Z7982 Long term (current) use of aspirin: Secondary | ICD-10-CM | POA: Diagnosis not present

## 2021-12-22 DIAGNOSIS — I130 Hypertensive heart and chronic kidney disease with heart failure and stage 1 through stage 4 chronic kidney disease, or unspecified chronic kidney disease: Secondary | ICD-10-CM | POA: Diagnosis not present

## 2021-12-22 DIAGNOSIS — I2510 Atherosclerotic heart disease of native coronary artery without angina pectoris: Secondary | ICD-10-CM | POA: Insufficient documentation

## 2021-12-22 DIAGNOSIS — N189 Chronic kidney disease, unspecified: Secondary | ICD-10-CM | POA: Diagnosis not present

## 2021-12-22 DIAGNOSIS — S83231A Complex tear of medial meniscus, current injury, right knee, initial encounter: Secondary | ICD-10-CM | POA: Diagnosis not present

## 2021-12-22 DIAGNOSIS — I5032 Chronic diastolic (congestive) heart failure: Secondary | ICD-10-CM | POA: Insufficient documentation

## 2021-12-22 DIAGNOSIS — E785 Hyperlipidemia, unspecified: Secondary | ICD-10-CM | POA: Diagnosis not present

## 2021-12-22 DIAGNOSIS — M2391 Unspecified internal derangement of right knee: Secondary | ICD-10-CM | POA: Diagnosis present

## 2021-12-22 DIAGNOSIS — Z9889 Other specified postprocedural states: Secondary | ICD-10-CM

## 2021-12-22 HISTORY — PX: KNEE ARTHROSCOPY: SHX127

## 2021-12-22 SURGERY — ARTHROSCOPY, KNEE
Anesthesia: General | Site: Knee | Laterality: Right

## 2021-12-22 MED ORDER — MIDAZOLAM HCL 2 MG/2ML IJ SOLN
INTRAMUSCULAR | Status: DC | PRN
  Administered 2021-12-22: 2 mg via INTRAVENOUS

## 2021-12-22 MED ORDER — CHLORHEXIDINE GLUCONATE 0.12 % MT SOLN: 15.0000 mL | Freq: Once | OROMUCOSAL | Status: AC

## 2021-12-22 MED ORDER — BUPIVACAINE-EPINEPHRINE 0.25% -1:200000 IJ SOLN
INTRAMUSCULAR | Status: DC | PRN
  Administered 2021-12-22: 25 mL
  Administered 2021-12-22: 5 mL

## 2021-12-22 MED ORDER — PHENYLEPHRINE HCL (PRESSORS) 10 MG/ML IV SOLN
INTRAVENOUS | Status: DC | PRN
Start: 2021-12-22 — End: 2021-12-22
  Administered 2021-12-22 (×2): 80 ug via INTRAVENOUS

## 2021-12-22 MED ORDER — LACTATED RINGERS IV SOLN: INTRAVENOUS | Status: DC

## 2021-12-22 MED ORDER — ACETAMINOPHEN 10 MG/ML IV SOLN: 1000.0000 mg | Freq: Once | INTRAVENOUS | Status: DC | PRN

## 2021-12-22 MED ORDER — FENTANYL CITRATE (PF) 100 MCG/2ML IJ SOLN
INTRAMUSCULAR | Status: DC | PRN
Start: 2021-12-22 — End: 2021-12-22
  Administered 2021-12-22: 50 ug via INTRAVENOUS

## 2021-12-22 MED ORDER — OXYCODONE HCL 5 MG/5ML PO SOLN: 5.0000 mg | Freq: Once | ORAL | Status: DC | PRN

## 2021-12-22 MED ORDER — FENTANYL CITRATE (PF) 100 MCG/2ML IJ SOLN
INTRAMUSCULAR | Status: AC
  Filled 2021-12-22: qty 2

## 2021-12-22 MED ORDER — PHENYLEPHRINE HCL-NACL 20-0.9 MG/250ML-% IV SOLN
INTRAVENOUS | Status: DC | PRN
Start: 2021-12-22 — End: 2021-12-22
  Administered 2021-12-22: 75 ug/min via INTRAVENOUS

## 2021-12-22 MED ORDER — FAMOTIDINE 20 MG PO TABS
ORAL_TABLET | ORAL | Status: AC
  Administered 2021-12-22: 20 mg via ORAL
  Filled 2021-12-22: qty 1

## 2021-12-22 MED ORDER — MIDAZOLAM HCL 2 MG/2ML IJ SOLN
INTRAMUSCULAR | Status: AC
  Filled 2021-12-22: qty 2

## 2021-12-22 MED ORDER — OXYCODONE HCL 5 MG PO TABS: 5.0000 mg | ORAL_TABLET | Freq: Once | ORAL | Status: DC | PRN

## 2021-12-22 MED ORDER — LIDOCAINE HCL (CARDIAC) PF 100 MG/5ML IV SOSY
PREFILLED_SYRINGE | INTRAVENOUS | Status: DC | PRN
  Administered 2021-12-22: 100 mg via INTRAVENOUS

## 2021-12-22 MED ORDER — ONDANSETRON HCL 4 MG/2ML IJ SOLN: 4.0000 mg | Freq: Once | INTRAMUSCULAR | Status: DC | PRN

## 2021-12-22 MED ORDER — FENTANYL CITRATE (PF) 100 MCG/2ML IJ SOLN: 25.0000 ug | INTRAMUSCULAR | Status: DC | PRN

## 2021-12-22 MED ORDER — HYDROCODONE-ACETAMINOPHEN 5-325 MG PO TABS: 1.0000 | ORAL_TABLET | ORAL | 0 refills | Status: DC | PRN

## 2021-12-22 MED ORDER — RINGERS IRRIGATION IR SOLN
Status: DC | PRN
  Administered 2021-12-22 (×2): 3000 mL

## 2021-12-22 MED ORDER — MORPHINE SULFATE 4 MG/ML IJ SOLN
INTRAMUSCULAR | Status: DC | PRN
  Administered 2021-12-22: 4 mg via INTRAVENOUS

## 2021-12-22 MED ORDER — CELECOXIB 200 MG PO CAPS: 400.0000 mg | ORAL_CAPSULE | Freq: Once | ORAL | Status: AC

## 2021-12-22 MED ORDER — DEXAMETHASONE SODIUM PHOSPHATE 10 MG/ML IJ SOLN
INTRAMUSCULAR | Status: DC | PRN
  Administered 2021-12-22: 10 mg via INTRAVENOUS

## 2021-12-22 MED ORDER — CELECOXIB 200 MG PO CAPS
ORAL_CAPSULE | ORAL | Status: AC
  Administered 2021-12-22: 400 mg via ORAL
  Filled 2021-12-22: qty 2

## 2021-12-22 MED ORDER — CHLORHEXIDINE GLUCONATE 0.12 % MT SOLN
OROMUCOSAL | Status: AC
  Administered 2021-12-22: 15 mL via OROMUCOSAL
  Filled 2021-12-22: qty 15

## 2021-12-22 MED ORDER — ORAL CARE MOUTH RINSE: 15.0000 mL | Freq: Once | OROMUCOSAL | Status: AC

## 2021-12-22 MED ORDER — EPHEDRINE SULFATE (PRESSORS) 50 MG/ML IJ SOLN
INTRAMUSCULAR | Status: DC | PRN
  Administered 2021-12-22 (×2): 10 mg via INTRAVENOUS

## 2021-12-22 MED ORDER — ONDANSETRON HCL 4 MG/2ML IJ SOLN
INTRAMUSCULAR | Status: DC | PRN
  Administered 2021-12-22: 4 mg via INTRAVENOUS

## 2021-12-22 MED ORDER — PROPOFOL 10 MG/ML IV BOLUS
INTRAVENOUS | Status: DC | PRN
  Administered 2021-12-22: 150 mg via INTRAVENOUS

## 2021-12-22 MED ORDER — FAMOTIDINE 20 MG PO TABS: 20.0000 mg | ORAL_TABLET | Freq: Once | ORAL | Status: AC

## 2021-12-22 SURGICAL SUPPLY — 34 items
ADAPTER IRRIG TUBE 2 SPIKE SOL (ADAPTER) ×4 IMPLANT
ADPR TBG 2 SPK PMP STRL ASCP (ADAPTER) ×2
BLADE SHAVER 4.5 DBL SERAT CV (CUTTER) IMPLANT
BNDG CMPR 75X41 PLY HI ABS (GAUZE/BANDAGES/DRESSINGS) ×1
BNDG CMPR STD VLCR NS LF 5.8X6 (GAUZE/BANDAGES/DRESSINGS) ×1
BNDG ELASTIC 6X5.8 VLCR NS LF (GAUZE/BANDAGES/DRESSINGS) ×2 IMPLANT
BNDG STRETCH 4X75 STRL LF (GAUZE/BANDAGES/DRESSINGS) ×1 IMPLANT
DRAPE ARTHRO LIMB 89X125 STRL (DRAPES) ×4 IMPLANT
DRSG DERMACEA 8X12 NADH (GAUZE/BANDAGES/DRESSINGS) ×2 IMPLANT
DURAPREP 26ML APPLICATOR (WOUND CARE) ×2 IMPLANT
GAUZE SPONGE 4X4 12PLY STRL (GAUZE/BANDAGES/DRESSINGS) ×2 IMPLANT
GLOVE SURG ENC TEXT LTX SZ7.5 (GLOVE) ×2 IMPLANT
GLOVE SURG UNDER LTX SZ8 (GLOVE) ×2 IMPLANT
GOWN STRL REUS W/ TWL LRG LVL3 (GOWN DISPOSABLE) ×2 IMPLANT
GOWN STRL REUS W/TWL LRG LVL3 (GOWN DISPOSABLE) ×4
IV LACTATED RINGER IRRG 3000ML (IV SOLUTION) ×4
IV LR IRRIG 3000ML ARTHROMATIC (IV SOLUTION) ×2 IMPLANT
KIT TURNOVER KIT A (KITS) ×2 IMPLANT
MANIFOLD NEPTUNE II (INSTRUMENTS) ×2 IMPLANT
PACK ARTHROSCOPY KNEE (MISCELLANEOUS) ×2 IMPLANT
PADDING CAST 6X4YD NS (MISCELLANEOUS) ×1
PADDING CAST COTTON 6X4 NS (MISCELLANEOUS) ×1 IMPLANT
SHAVER BLADE TAPERED BLUNT 4 (BLADE) ×2 IMPLANT
SOL PREP PVP 2OZ (MISCELLANEOUS) ×2
SOLUTION PREP PVP 2OZ (MISCELLANEOUS) ×1 IMPLANT
SPONGE T-LAP 18X18 ~~LOC~~+RFID (SPONGE) ×2 IMPLANT
STOCKINETTE BIAS CUT 6 980064 (GAUZE/BANDAGES/DRESSINGS) ×1 IMPLANT
SUT ETHILON 3-0 FS-10 30 BLK (SUTURE) ×2
SUTURE EHLN 3-0 FS-10 30 BLK (SUTURE) ×1 IMPLANT
TUBING INFLOW SET DBFLO PUMP (TUBING) ×2 IMPLANT
TUBING OUTFLOW SET DBLFO PUMP (TUBING) ×2 IMPLANT
WAND HAND CNTRL MULTIVAC 50 (MISCELLANEOUS) ×2 IMPLANT
WATER STERILE IRR 500ML POUR (IV SOLUTION) ×2 IMPLANT
WRAP KNEE W/COLD PACKS 25.5X14 (SOFTGOODS) ×2 IMPLANT

## 2021-12-22 NOTE — Transfer of Care (Signed)
Immediate Anesthesia Transfer of Care Note  Patient: Luke Glenn  Procedure(s) Performed: ARTHROSCOPY KNEE (Right: Knee)  Patient Location: PACU  Anesthesia Type:General  Level of Consciousness: sedated  Airway & Oxygen Therapy: Patient Spontanous Breathing and Patient connected to face mask oxygen  Post-op Assessment: Report given to RN and Post -op Vital signs reviewed and stable  Post vital signs: Reviewed and stable  Last Vitals:  Vitals Value Taken Time  BP 124/84 12/22/21 1647  Temp 36.5 C 12/22/21 1647  Pulse 55 12/22/21 1653  Resp 11 12/22/21 1653  SpO2 100 % 12/22/21 1653  Vitals shown include unvalidated device data.  Last Pain:  Vitals:   12/22/21 1411  TempSrc: Temporal  PainSc: 0-No pain         Complications: No notable events documented.

## 2021-12-22 NOTE — Op Note (Signed)
OPERATIVE NOTE  DATE OF SURGERY:  12/22/2021  PATIENT NAME:  Luke Glenn   DOB: 1961-04-10  MRN: [id number]   PRE-OPERATIVE DIAGNOSIS:  Internal derangement of the right knee   POST-OPERATIVE DIAGNOSIS:   Complex tear of the posterior horn medial meniscus, right knee Grade III chondromalacia of the medial tibial plateau, right knee  PROCEDURE:  Right knee arthroscopy, partial medial meniscectomy, and chondroplasty  SURGEON:  Marciano Sequin., M.D.   ASSISTANT: none  ANESTHESIA: general  ESTIMATED BLOOD LOSS: Minimal  FLUIDS REPLACED: 700 mL of crystalloid  TOURNIQUET TIME: Not used  INDICATIONS FOR SURGERY: BERTON BUTRICK is a 61 y.o. year old male who has been seen for complaints of right knee pain. MRI demonstrated findings consistent with meniscal pathology. After discussion of the risks and benefits of surgical intervention, the patient expressed understanding of the risks benefits and agree with plans for right knee arthroscopy.   PROCEDURE IN DETAIL: The patient was brought into the operating room and, after adequate general anesthesia was achieved, a tourniquet was applied to the right thigh and the leg was placed in the leg holder. All bony prominences were well padded. The patient's right knee was cleaned and prepped with alcohol and Duraprep and draped in the usual sterile fashion. A "timeout" was performed as per usual protocol. The anticipated portal sites were injected with 0.25% Marcaine with epinephrine. An anterolateral incision was made and a cannula was inserted. A small effusion was evacuated and the knee was distended with fluid using the pump. The scope was advanced down the medial gutter into the medial compartment. Under visualization with the scope, an anteromedial portal was created and a hooked probe was inserted. The medial meniscus was visualized and probed.  There was a complex tear of the posterior horn of the medial meniscus.  The tear was debrided  using meniscal punctures and a 4.0 mm shaver.  Final contouring was performed using the 50 degree ArthroCare wand.  The remaining rim of meniscus was visualized and probed and felt to be stable.  The articular cartilage was visualized.  There was a relatively localized area of grade III chondromalacia involving the medial tibial plateau near the area of the previous tear.  The area was debrided and contoured using the ArthroCare wand.  Several lesser areas of grade 2 to early grade 3 chondral changes were noted to the medial femoral condyle and these areas were also debrided using the ArthroCare wand.  The scope was then advanced into the intercondylar notch. The anterior cruciate ligament was visualized and probed and felt to be intact. The scope was removed from the lateral portal and reinserted via the anteromedial portal to better visualize the lateral compartment. The lateral meniscus was visualized and probed.  The lateral meniscus was intact without evidence of tear or instability.  The articular cartilage of the lateral compartment was visualized.  The articular surface was in excellent condition.  Finally, the scope was advanced so as to visualize the patellofemoral articulation. Good patellar tracking was appreciated.  The articular surface was in good condition.  The knee was irrigated with copius amounts of fluid and suctioned dry. The anterolateral portal was re-approximated with #3-0 nylon. A combination of 0.25% Marcaine with epinephrine and 4 mg of Morphine were injected via the scope. The scope was removed and the anteromedial portal was re-approximated with #3-0 nylon. A sterile dressing was applied followed by application of an ice wrap.  The patient tolerated the procedure well  and was transported to the PACU in stable condition.  Mauri Tolen P. Holley Bouche., M.D.

## 2021-12-22 NOTE — Anesthesia Procedure Notes (Signed)
Procedure Name: LMA Insertion Date/Time: 12/22/2021 4:00 PM Performed by: Nelda Marseille, CRNA Pre-anesthesia Checklist: Patient identified, Patient being monitored, Timeout performed, Emergency Drugs available and Suction available Patient Re-evaluated:Patient Re-evaluated prior to induction Oxygen Delivery Method: Circle system utilized Preoxygenation: Pre-oxygenation with 100% oxygen Induction Type: IV induction Ventilation: Mask ventilation without difficulty LMA: LMA inserted LMA Size: 4.5 Tube type: Oral Number of attempts: 1 Placement Confirmation: positive ETCO2 and breath sounds checked- equal and bilateral Tube secured with: Tape Dental Injury: Teeth and Oropharynx as per pre-operative assessment

## 2021-12-22 NOTE — Anesthesia Preprocedure Evaluation (Addendum)
Anesthesia Evaluation  Patient identified by MRN, date of birth, ID band Patient awake    Reviewed: Allergy & Precautions, NPO status , Patient's Chart, lab work & pertinent test results  History of Anesthesia Complications Negative for: history of anesthetic complications  Airway Mallampati: II   Neck ROM: Full    Dental no notable dental hx.    Pulmonary former smoker (quit 2002),    Pulmonary exam normal breath sounds clear to auscultation       Cardiovascular hypertension, + CAD (s/p MI and stents on Plavix, last dose 12/16/21)  Normal cardiovascular exam Rhythm:Regular Rate:Normal  Echo 06/20/21:  1. Mild left ventricular systolic dysfunction with an EF of 50% 2. Left ventricular GLS -16.1% 3. Normal LA pressures with normal diastolic function 4. Normal right ventricular systolic function 5. Trivial MR 6. Mild TR and PR 7. No AR 8. No valvular stenosis 9. Hypokinetic left ventricular septal wall 10. Mild right ventricular and atrial enlargement 11. No evidence of pericardial effusion  ECG 06/20/21:  Sinus rhythm  ST elevation, probably due to early repolarization  Otherwise normal ECG   Neuro/Psych negative neurological ROS     GI/Hepatic negative GI ROS,   Endo/Other  negative endocrine ROS  Renal/GU Renal disease (left renal atrophy)   BPH    Musculoskeletal   Abdominal   Peds  Hematology negative hematology ROS (+)   Anesthesia Other Findings Reviewed 06/20/21 cardiology note.  Reviewed and agree with Bayard Males pre-anesthesia clinical review note.  Reproductive/Obstetrics                            Anesthesia Physical Anesthesia Plan  ASA: 3  Anesthesia Plan: General   Post-op Pain Management:    Induction: Intravenous  PONV Risk Score and Plan: 2 and Ondansetron, Dexamethasone and Treatment may vary due to age or medical condition  Airway Management Planned:  LMA  Additional Equipment:   Intra-op Plan:   Post-operative Plan: Extubation in OR  Informed Consent: I have reviewed the patients History and Physical, chart, labs and discussed the procedure including the risks, benefits and alternatives for the proposed anesthesia with the patient or authorized representative who has indicated his/her understanding and acceptance.     Dental advisory given  Plan Discussed with: CRNA  Anesthesia Plan Comments: (Patient consented for risks of anesthesia including but not limited to:  - adverse reactions to medications - damage to eyes, teeth, lips or other oral mucosa - nerve damage due to positioning  - sore throat or hoarseness - damage to heart, brain, nerves, lungs, other parts of body or loss of life  Informed patient about role of CRNA in peri- and intra-operative care.  Patient voiced understanding.)        Anesthesia Quick Evaluation

## 2021-12-22 NOTE — H&P (Signed)
The patient has been re-examined, and the chart reviewed, and there have been no interval changes to the documented history and physical.    The risks, benefits, and alternatives have been discussed at length. The patient expressed understanding of the risks benefits and agreed with plans for surgical intervention.  Jud Fanguy P. Johncarlos Holtsclaw, Jr. M.D.    

## 2021-12-23 ENCOUNTER — Encounter: Payer: Self-pay | Admitting: Orthopedic Surgery

## 2021-12-23 NOTE — Anesthesia Postprocedure Evaluation (Signed)
Anesthesia Post Note  Patient: Luke Glenn  Procedure(s) Performed: ARTHROSCOPY KNEE (Right: Knee)  Patient location during evaluation: PACU Anesthesia Type: General Level of consciousness: awake and alert, oriented and patient cooperative Pain management: pain level controlled Vital Signs Assessment: post-procedure vital signs reviewed and stable Respiratory status: spontaneous breathing, nonlabored ventilation and respiratory function stable Cardiovascular status: blood pressure returned to baseline and stable Postop Assessment: adequate PO intake Anesthetic complications: no   No notable events documented.   Last Vitals:  Vitals:   12/22/21 1730 12/22/21 1739  BP: (!) 141/87 137/90  Pulse: (!) 58 (!) 52  Resp: 15 18  Temp: (!) 36.4 C (!) 36.1 C  SpO2: 99% 100%    Last Pain:  Vitals:   12/22/21 1739  TempSrc: Temporal  PainSc: 0-No pain                 Darrin Nipper

## 2022-03-16 ENCOUNTER — Other Ambulatory Visit: Payer: Self-pay

## 2022-06-24 ENCOUNTER — Other Ambulatory Visit: Payer: Self-pay | Admitting: Orthopedic Surgery

## 2022-06-24 DIAGNOSIS — G8929 Other chronic pain: Secondary | ICD-10-CM

## 2022-06-24 DIAGNOSIS — Z9889 Other specified postprocedural states: Secondary | ICD-10-CM

## 2022-06-25 ENCOUNTER — Ambulatory Visit
Admission: RE | Admit: 2022-06-25 | Discharge: 2022-06-25 | Disposition: A | Discharge status: Home / Self Care | Payer: Managed Care, Other (non HMO) | Attending: Neurosurgery | Admitting: Neurosurgery

## 2022-06-25 ENCOUNTER — Ambulatory Visit
Admission: RE | Admit: 2022-06-25 | Discharge: 2022-06-25 | Disposition: A | Discharge status: Home / Self Care | Payer: Managed Care, Other (non HMO) | Source: Ambulatory Visit | Attending: Neurosurgery | Admitting: Neurosurgery

## 2022-06-25 ENCOUNTER — Ambulatory Visit (INDEPENDENT_AMBULATORY_CARE_PROVIDER_SITE_OTHER): Payer: Managed Care, Other (non HMO) | Admitting: Neurosurgery

## 2022-06-25 DIAGNOSIS — Z981 Arthrodesis status: Secondary | ICD-10-CM

## 2022-06-25 DIAGNOSIS — M545 Low back pain, unspecified: Secondary | ICD-10-CM | POA: Insufficient documentation

## 2022-06-25 DIAGNOSIS — G8929 Other chronic pain: Secondary | ICD-10-CM

## 2022-06-25 MED ORDER — METHOCARBAMOL 500 MG PO TABS: 500.0000 mg | ORAL_TABLET | Freq: Three times a day (TID) | ORAL | 0 refills | Status: DC | PRN

## 2022-06-25 NOTE — Progress Notes (Signed)
Follow-up note: Referring Physician:  No referring provider defined for this encounter.  Primary Physician:  Luke Ramsay, MD  Chief Complaint:  continued low back pain  DOS: 07/30/21 open L4-5 transforaminal interbody fusion   History of Present Illness: Luke Glenn is a 61 y.o. male who presents with the chief complaint of low back pain.  He has dealt with this off and on since his surgery last year however his most recent episode has lasted for several weeks and has not improved though it has in the past.  He describes dull achy pain in his low back that is worse with activity and improves with rest.  He denies any radicular complaints. he has continued with physical therapy and has started over-the-counter ibuprofen per Dr. Clydell Hakim recommendation for his right knee pain.  LOV 04/30/22 Luke Glenn is status post L4-5 TLIF. he is doing reasonably well.  He had a knee arthroscopy in February. He still has some achiness in his back. He has some numbness in his right foot. He is not sure whether Lyrica is helping him  Review of Systems:  A 10 point review of systems is negative,  and the pertinent positives and negatives detailed in the HPI.  Past Medical History: Past Medical History:  Diagnosis Date   (HFpEF) heart failure with preserved ejection fraction ([place])    a.) TTE 10/14/2017: EF 45%; mild global dysfunction; triv AR/PR, mild MR/TR. b.) TTE 06/20/2021: EF 50%; GLS -16.1%; mild LV dysfunction; septal HL; triv MR, mild TR/PR.   BPH (benign prostatic hyperplasia)    Chronic anticoagulation    a.) on DAPT therapy (ASA + clopidogrel)   Coronary artery disease    a.) PCI 07/11/2001: 95% mLAD -->  2.5 x 18 mm Cordis BX Velocity to mLAD. b.) PCI 11/25/2005: 50% ISR mLAD; 95% pD2 --> 2.5 x 16 mm Taxus DES to pD2. c.) PCI 02/22/2007: 80% ISR mLAD --> 2.5 x 20 mm Taxus DES to mLAD. d.) PCI 09/19/2007: 60% ISR mLAD; FFR low at 0.73-0.75 --> 2.5 x 18 mm Xience DES to mLAD.    DDD (degenerative disc disease), lumbosacral    Elevated lipids    History of heart artery stent    a.) total of 4 stents: 2.5 x 18 mm Cordis BX Velocity mLAD (07/2001), 2.5 x 16 mm Taxus pD2 (11/2005), 2.5 x 20 mm Taxus mLAD (02/2007), 2.5 x 18 mm Xience DES mLAD (09/2007).   Hypertension    Left renal atrophy    ST elevation myocardial infarction (STEMI) of anterior wall ([place]) 07/12/2001   a.) transferred to [facility]; PCI --> LVEF 53%; 95% mLAD - 2.5 x 18 mm Cordis BX Velocity stent placed.   Valvular regurgitation    a.) TTE on 06/20/2021 --> mild TR, PR; trivial MR    Past Surgical History: Past Surgical History:  Procedure Laterality Date   COLONOSCOPY WITH ESOPHAGOGASTRODUODENOSCOPY (EGD)     COLONOSCOPY WITH PROPOFOL N/A 12/25/2015   Procedure: COLONOSCOPY WITH PROPOFOL;  Surgeon: Manya Silvas, MD;  Location: [place];  Service: Endoscopy;  Laterality: N/A;   CORONARY ANGIOPLASTY WITH STENT PLACEMENT Left 07/12/2001   Procedure: LEFT HEART CATHETERIZATION; PCI WITH STENT PLACEMENT (2.5 x 18 mm Cordis BX Velocity stent x 1 to mLAD); Location: [facility]; Surgeon: Pura Spice, MD   CORONARY ANGIOPLASTY WITH STENT PLACEMENT Left 02/22/2007   Procedure: LEFT HEART CATHETERIZATION; PCI WITH STENT PLACEMENT (2.5 x 20 mm Taxus DES to mLAD); Location: [facility]; Surgeon: Lovena Neighbours, MD  CORONARY ANGIOPLASTY WITH STENT PLACEMENT Left 09/19/2007   Procedure: LEFT HEART CATHETERIZATION; PCI WITH STENT PLACEMENT (2.5 x 18 mm Xience DES to mLAD); Location: [facility]; Surgeon: Joeseph Amor, MD   CORONARY ANGIOPLASTY WITH STENT PLACEMENT Left 11/25/2005   Procedure: LEFT HEART CATHETERIZATION; PCI WITH STENT PLACEMENT (2.5 x 16 mm Taxus DES to pD2); Location: [facility]; Surgeon: ???, MD   KNEE ARTHROSCOPY Right 12/22/2021   Procedure: ARTHROSCOPY KNEE;  Surgeon: Dereck Leep, MD;  Location: [facility];  Service: Orthopedics;  Laterality: Right;   MAXIMUM ACCESS (MAS) TRANSFORAMINAL LUMBAR INTERBODY FUSION  (TLIF) 1 LEVEL N/A 07/30/2021   Procedure: OPEN L4-5 TRANSFORAMINAL LUMBAR INTERBODY FUSION (TLIF) 1 LEVEL;  Surgeon: Meade Maw, MD;  Location: [facility];  Service: Neurosurgery;  Laterality: N/A;   MENISCECTOMY Right 02/08/1995   Procedure: MEDIAL MENISCECTOMY; Location: [facility]; Surgeon: Kelli Hope, MD    Allergies: Allergies as of 06/25/2022   (No Known Allergies)    Medications: Outpatient Encounter Medications as of 06/25/2022  Medication Sig   Ascorbic Acid (VITAMIN C) 1000 MG tablet Take 2,000 mg by mouth daily.   aspirin 81 MG tablet Take 81 mg by mouth daily.   azelastine (ASTELIN) 0.1 % nasal spray PLACE 1 SPRAY INTO BOTH NOSTRILS 2 TIMES DAILY AS DIRECTED (Patient taking differently: Place 1 spray into both nostrils 2 (two) times daily as needed for rhinitis or allergies.)   Biotin 5000 MCG CAPS Take 5,000 mcg by mouth daily.   cetirizine (ZYRTEC) 10 MG tablet Take 10 mg by mouth every morning.   Cholecalciferol (VITAMIN D3) 50 MCG (2000 UT) TABS Take 2,000 Units by mouth daily.   clopidogrel (PLAVIX) 75 MG tablet TAKE 1 TABLET BY MOUTH DAILY.   COVID-19 mRNA bivalent vaccine, Moderna, (MODERNA COVID-19 BIVAL BOOSTER) 50 MCG/0.5ML injection Inject into the muscle.   finasteride (PROSCAR) 5 MG tablet TAKE 1 TABLET BY MOUTH DAILY. (Patient taking differently: Take by mouth at bedtime.)   fluticasone (FLONASE) 50 MCG/ACT nasal spray PLACE 2 SPRAYS INTO BOTH NOSTRILS DAILY. (Patient taking differently: Place 2 sprays into both nostrils daily as needed for allergies.)   HYDROcodone-acetaminophen (NORCO) 5-325 MG tablet Take 1-2 tablets by mouth every 4 (four) hours as needed for moderate pain.   ibuprofen (ADVIL) 200 MG tablet Take 400 mg by mouth every 6 (six) hours as needed.   influenza vac split quadrivalent PF (FLUARIX QUADRIVALENT) 0.5 ML injection Inject into the muscle.   lisinopril (ZESTRIL) 20 MG tablet Take 0.5 tablets (10 mg total) by mouth daily. (Patient not  taking: Reported on 12/05/2021)   lisinopril (ZESTRIL) 20 MG tablet TAKE 1 TABLET BY MOUTH DAILY (Patient not taking: Reported on 12/05/2021)   losartan (COZAAR) 50 MG tablet Take 50 mg by mouth every morning.   Magnesium Oxide 250 MG TABS Take 500 mg by mouth daily.   Melatonin 10 MG TABS Take 10 mg by mouth at bedtime.   methocarbamol (ROBAXIN) 500 MG tablet Take 1 tablet (500 mg total) by mouth every 6 (six) hours. (Patient not taking: Reported on 12/05/2021)   metoprolol succinate (TOPROL-XL) 25 MG 24 hr tablet Take 1 tablet (25 mg total) by mouth daily. (Patient taking differently: Take 25 mg by mouth every morning.)   Misc Natural Products (GLUCOSAMINE CHOND MSM FORMULA PO) Take 2 tablets by mouth daily.   niacin 500 MG tablet Take 1,000 mg by mouth daily.   Omega-3 Fatty Acids (FISH OIL) 1000 MG CAPS Take 1,000 mg by mouth daily.   pregabalin (  LYRICA) 75 MG capsule Take 1 capsule by mouth every night at bedtime for 4 days, then take 1 capsule by mouth two times daily (Patient not taking: Reported on 12/05/2021)   rosuvastatin (CRESTOR) 40 MG tablet TAKE 1 TABLET BY MOUTH DAILY (Patient taking differently: Take 40 mg by mouth every morning.)   senna (SENOKOT) 8.6 MG TABS tablet Take 1 tablet (8.6 mg total) by mouth 2 (two) times daily. (Patient not taking: Reported on 12/05/2021)   vitamin B-12 (CYANOCOBALAMIN) 1000 MCG tablet Take 1,000 mcg by mouth daily.   No facility-administered encounter medications on file as of 06/25/2022.    Social History: Social History   Tobacco Use   Smoking status: Former    Packs/day: 0.50    Years: 20.00    Total pack years: 10.00    Types: Cigars, Cigarettes    Quit date: 2002    Years since quitting: 21.6   Smokeless tobacco: Never  Vaping Use   Vaping Use: Never used  Substance Use Topics   Alcohol use: Yes    Comment: 1-2 beers less than 3 times a week   Drug use: No    Family Medical History: Family History  Problem Relation Age of Onset    Dementia Mother    Diabetes Father    Heart attack Father    Bladder Cancer Father    Stroke Maternal Grandmother    Heart attack Maternal Grandmother    Cancer Maternal Grandfather        unknown type   Gout Maternal Grandfather    Diabetes Paternal Grandmother    Heart attack Paternal Grandfather    Kidney cancer Neg Hx    Prostate cancer Neg Hx     Exam:  There were no vitals filed for this visit. There is no height or weight on file to calculate BMI.   General: A&O.  ROM of spine: WNL.  Palpation of spine: non TTP.  Strength in the left lower extremity is EHL 5/5, Dorsiflexion 5/5, Plantar flexion 5/5, Hamstring 5/5, Quadricep 5/5, Iliopsoas 5/5. Strength in the right lower extremity is EHL 5/5, Dorsiflexion 5/5, Plantar flexion 5/5, Hamstring 5/5, Quadricep 5/5, Iliopsoas 5/5. Reflexes are 2+ and symmetric at the patella and achilles.   Bilateral lower extremity sensation is intact to light touch.  Gait is normal.  Imaging: 07/31/21 lumbar xrays  IMPRESSION: Well-positioned L4-5 fusion hardware.     Electronically Signed   By: Marijo Sanes M.D.   On: 07/31/2021 13:26  I have personally reviewed the images and agree with the above interpretation.  Assessment and Plan: Mr. Hume is a pleasant 61 y.o. male with chronic lumbosacral complaints.  He continues to have some achy low back pain intermittently.  We discussed that this is not uncommon after surgery and the goal is for surgery to make things manageable.  We will nevertheless move forward with lumbar x-rays just to ensure that his hardware is intact.  I have also given him a prescription for Robaxin to try.  We discussed medication side effects.  I will contact him via MyChart with the results of his x-rays.  Should his symptoms continue, or worsen we will consider ordering an MRI and possibly a CT scan.  He was encouraged to call the office should he have any additional questions or concerns.  I spent a total of 20  minutes in both face-to-face and non-face-to-face activities for this visit on the date of this encounter including review of records, review of  imaging, discussion of symptoms, physical exam, discussion of differential diagnosis, and documentation.  Cooper Render PA-C Neurosurgery

## 2022-06-28 ENCOUNTER — Ambulatory Visit
Admission: RE | Admit: 2022-06-28 | Discharge: 2022-06-28 | Disposition: A | Discharge status: Home / Self Care | Payer: Managed Care, Other (non HMO) | Source: Ambulatory Visit | Attending: Orthopedic Surgery | Admitting: Orthopedic Surgery

## 2022-06-28 DIAGNOSIS — G8929 Other chronic pain: Secondary | ICD-10-CM | POA: Diagnosis present

## 2022-06-28 DIAGNOSIS — M25561 Pain in right knee: Secondary | ICD-10-CM | POA: Insufficient documentation

## 2022-06-28 DIAGNOSIS — Z9889 Other specified postprocedural states: Secondary | ICD-10-CM | POA: Insufficient documentation

## 2022-07-10 DIAGNOSIS — U071 COVID-19: Secondary | ICD-10-CM

## 2022-07-10 HISTORY — DX: COVID-19: U07.1

## 2022-07-21 NOTE — Telephone Encounter (Signed)
Patient calling back to schedule appt with Dr.Yarbrough. Since he saw Andee Poles last month and she did mention ordering an MRI, would Dr.Yarbrough like to do that first?

## 2022-07-23 NOTE — Telephone Encounter (Signed)
Can you review this message? He is still having back pain. He has already met his deductible for year and wants any scans or test done before it starts all over. 

## 2022-07-24 NOTE — Telephone Encounter (Signed)
He confirmed appt for 07/28/2022.

## 2022-07-24 NOTE — Telephone Encounter (Signed)
Patient calling back. Should he have a MRI? He is still having pain. His brace is worn out. Can he get a response please.

## 2022-07-28 ENCOUNTER — Ambulatory Visit (INDEPENDENT_AMBULATORY_CARE_PROVIDER_SITE_OTHER): Payer: Managed Care, Other (non HMO) | Admitting: Neurosurgery

## 2022-07-28 ENCOUNTER — Encounter: Payer: Self-pay | Admitting: Neurosurgery

## 2022-07-28 VITALS — BP 126/82 | Ht 68.0 in | Wt 189.2 lb

## 2022-07-28 DIAGNOSIS — Z981 Arthrodesis status: Secondary | ICD-10-CM

## 2022-07-28 DIAGNOSIS — M545 Low back pain, unspecified: Secondary | ICD-10-CM

## 2022-07-28 DIAGNOSIS — G8929 Other chronic pain: Secondary | ICD-10-CM | POA: Diagnosis not present

## 2022-07-28 NOTE — Progress Notes (Signed)
   DOS: 07/30/21 (L4-5 TLIF)  HISTORY OF PRESENT ILLNESS: 07/28/2022 [name] is status post the left.  He is having significant knee issues.  He has some low back pain particularly after he took a long car trip.Marland Kitchen   PHYSICAL EXAMINATION:   Vitals:   07/28/22 1356  BP: 126/82   General: Patient is well developed, well nourished, calm, collected, and in no apparent distress.  NEUROLOGICAL:  General: In no acute distress.  Awake, alert, oriented to person, place, and time. Pupils equal round and reactive to light.   Strength:  Side Iliopsoas Quads Hamstring PF DF EHL  R '5 5 5 5 5 5  '$ L '5 5 5 5 5 5   '$ Incision c/d/i   ROS (Neurologic): Negative except as noted above  IMAGING: No complications noted  ASSESSMENT/PLAN:  Luke Glenn is doing reasonably well from his lower back surgery.  At this point, I have asked him to continue NSAIDs and exercises.  I think he will stabilize over time.  If he develops new symptoms, we will discuss further imaging.  He will be trying a new brace soon for his knee pain.  Hopefully that will help.  I spent a total of 10 minutes in face-to-face and non-face-to-face activities related to this patient's care today.   Meade Maw MD, [facility] Department of Neurosurgery

## 2022-08-18 ENCOUNTER — Ambulatory Visit (INDEPENDENT_AMBULATORY_CARE_PROVIDER_SITE_OTHER): Payer: Managed Care, Other (non HMO) | Admitting: Urology

## 2022-08-18 ENCOUNTER — Encounter: Payer: Self-pay | Admitting: Urology

## 2022-08-18 VITALS — BP 127/84 | HR 90 | Ht 69.0 in | Wt 189.0 lb

## 2022-08-18 DIAGNOSIS — R351 Nocturia: Secondary | ICD-10-CM

## 2022-08-18 DIAGNOSIS — N401 Enlarged prostate with lower urinary tract symptoms: Secondary | ICD-10-CM

## 2022-08-18 DIAGNOSIS — R399 Unspecified symptoms and signs involving the genitourinary system: Secondary | ICD-10-CM

## 2022-08-18 DIAGNOSIS — Z125 Encounter for screening for malignant neoplasm of prostate: Secondary | ICD-10-CM | POA: Diagnosis not present

## 2022-08-18 LAB — BLADDER SCAN AMB NON-IMAGING

## 2022-08-18 NOTE — Patient Instructions (Signed)
Minimize fluids 3 to 4 hours prior to bedtime, and try to urinate right before going to bed.  Avoid diet drinks, soda, and tea in the afternoon and evening, as these can increase urination overnight.  Sleep Apnea Sleep apnea is a condition in which breathing pauses or becomes shallow during sleep. People with sleep apnea usually snore loudly. They may have times when they gasp and stop breathing for 10 seconds or more during sleep. This may happen many times during the night. Sleep apnea disrupts your sleep and keeps your body from getting the rest that it needs. This condition can increase your risk of certain health problems, including: Heart attack. Stroke. Obesity. Type 2 diabetes. Heart failure. Irregular heartbeat. High blood pressure. The goal of treatment is to help you breathe normally again. What are the causes?  The most common cause of sleep apnea is a collapsed or blocked airway. There are three kinds of sleep apnea: Obstructive sleep apnea. This kind is caused by a blocked or collapsed airway. Central sleep apnea. This kind happens when the part of the brain that controls breathing does not send the correct signals to the muscles that control breathing. Mixed sleep apnea. This is a combination of obstructive and central sleep apnea. What increases the risk? You are more likely to develop this condition if you: Are overweight. Smoke. Have a smaller than normal airway. Are older. Are male. Drink alcohol. Take sedatives or tranquilizers. Have a family history of sleep apnea. Have a tongue or tonsils that are larger than normal. What are the signs or symptoms? Symptoms of this condition include: Trouble staying asleep. Loud snoring. Morning headaches. Waking up gasping. Dry mouth or sore throat in the morning. Daytime sleepiness and tiredness. If you have daytime fatigue because of sleep apnea, you may be more likely to have: Trouble  concentrating. Forgetfulness. Irritability or mood swings. Personality changes. Feelings of depression. Sexual dysfunction. This may include loss of interest if you are male, or erectile dysfunction if you are male. How is this diagnosed? This condition may be diagnosed with: A medical history. A physical exam. A series of tests that are done while you are sleeping (sleep study). These tests are usually done in a sleep lab, but they may also be done at home. How is this treated? Treatment for this condition aims to restore normal breathing and to ease symptoms during sleep. It may involve managing health issues that can affect breathing, such as high blood pressure or obesity. Treatment may include: Sleeping on your side. Using a decongestant if you have nasal congestion. Avoiding the use of depressants, including alcohol, sedatives, and narcotics. Losing weight if you are overweight. Making changes to your diet. Quitting smoking. Using a device to open your airway while you sleep, such as: An oral appliance. This is a custom-made mouthpiece that shifts your lower jaw forward. A continuous positive airway pressure (CPAP) device. This device blows air through a mask when you breathe out (exhale). A nasal expiratory positive airway pressure (EPAP) device. This device has valves that you put into each nostril. A bi-level positive airway pressure (BIPAP) device. This device blows air through a mask when you breathe in (inhale) and breathe out (exhale). Having surgery if other treatments do not work. During surgery, excess tissue is removed to create a wider airway. Follow these instructions at home: Lifestyle Make any lifestyle changes that your health care provider recommends. Eat a healthy, well-balanced diet. Take steps to lose weight if you are  overweight. Avoid using depressants, including alcohol, sedatives, and narcotics. Do not use any products that contain nicotine or tobacco.  These products include cigarettes, chewing tobacco, and vaping devices, such as e-cigarettes. If you need help quitting, ask your health care provider. General instructions Take over-the-counter and prescription medicines only as told by your health care provider. If you were given a device to open your airway while you sleep, use it only as told by your health care provider. If you are having surgery, make sure to tell your health care provider you have sleep apnea. You may need to bring your device with you. Keep all follow-up visits. This is important. Contact a health care provider if: The device that you received to open your airway during sleep is uncomfortable or does not seem to be working. Your symptoms do not improve. Your symptoms get worse. Get help right away if: You develop: Chest pain. Shortness of breath. Discomfort in your back, arms, or stomach. You have: Trouble speaking. Weakness on one side of your body. Drooping in your face. These symptoms may represent a serious problem that is an emergency. Do not wait to see if the symptoms will go away. Get medical help right away. Call your local emergency services (911 in the [place] Do not drive yourself to the hospital. Summary Sleep apnea is a condition in which breathing pauses or becomes shallow during sleep. The most common cause is a collapsed or blocked airway. The goal of treatment is to restore normal breathing and to ease symptoms during sleep. This information is not intended to replace advice given to you by your health care provider. Make sure you discuss any questions you have with your health care provider. Document Revised: 06/04/2021 Document Reviewed: 10/04/2020 Elsevier Patient Education  [employer].

## 2022-08-18 NOTE — Progress Notes (Signed)
   08/18/2022 9:25 AM   Luke Glenn [birth year] [id number]  Reason for visit: Follow up urinary symptoms, nocturia, PSA screening, family history of bladder cancer, chronic left renal atrophy  HPI: 61 year old male we have followed for the above issues.  He also has chronic left renal atrophy that is unchanged from 2011 with no left-sided flank pain, normal renal function.  At our last visit he reported nocturia 2-3 times at night, but was drinking multiple bottles of diet sports drink in the evenings to try to prevent leg cramps overnight.  He also has never been evaluated for sleep apnea.  He was started on finasteride 5 mg daily by PCP last year.  He denies significant urinary complaints during the day.   PSA from January 2023 was normal at 0.05(corrected for finasteride 0.1).  He continues to drink diet soda in the evenings, which likely contributes to his persistent nocturia 2-3 times overnight.  IPSS score is 5, with quality of life mostly satisfied, and PVR normal at 78 mL.  He remains on the finasteride for both BPH and hair loss, and would like to continue this medication.  He has a family history of bladder cancer.  He denies any gross hematuria. Urinalysis last year was benign.  Continue finasteride Recommend sleep apnea evaluation, behavioral strategies discussed regarding nocturia RTC 1 year UA(family history of bladder cancer), PVR  Billey Co, MD  [facility] [address], [place], [place] [address] [phone]

## 2022-09-03 ENCOUNTER — Other Ambulatory Visit: Payer: Self-pay | Admitting: Surgery

## 2022-09-14 ENCOUNTER — Encounter
Admission: RE | Admit: 2022-09-14 | Discharge: 2022-09-14 | Disposition: A | Discharge status: Home / Self Care | Payer: Managed Care, Other (non HMO) | Source: Ambulatory Visit | Attending: Surgery | Admitting: Surgery

## 2022-09-14 VITALS — BP 122/85 | HR 67 | Resp 16 | Ht 69.0 in | Wt 200.0 lb

## 2022-09-14 DIAGNOSIS — Z01818 Encounter for other preprocedural examination: Secondary | ICD-10-CM | POA: Insufficient documentation

## 2022-09-14 DIAGNOSIS — Z01812 Encounter for preprocedural laboratory examination: Secondary | ICD-10-CM

## 2022-09-14 HISTORY — DX: Other complications of anesthesia, initial encounter: T88.59XA

## 2022-09-14 LAB — URINALYSIS, ROUTINE W REFLEX MICROSCOPIC
Bilirubin Urine: NEGATIVE
Glucose, UA: NEGATIVE mg/dL
Hgb urine dipstick: NEGATIVE
Ketones, ur: NEGATIVE mg/dL
Leukocytes,Ua: NEGATIVE
Nitrite: NEGATIVE
Protein, ur: NEGATIVE mg/dL
Specific Gravity, Urine: 1.003 — ABNORMAL LOW (ref 1.005–1.030)
pH: 7 (ref 5.0–8.0)

## 2022-09-14 LAB — COMPREHENSIVE METABOLIC PANEL
ALT: 35 U/L (ref 0–44)
AST: 30 U/L (ref 15–41)
Albumin: 3.7 g/dL (ref 3.5–5.0)
Alkaline Phosphatase: 79 U/L (ref 38–126)
Anion gap: 8 (ref 5–15)
BUN: 14 mg/dL (ref 8–23)
CO2: 25 mmol/L (ref 22–32)
Calcium: 8.6 mg/dL — ABNORMAL LOW (ref 8.9–10.3)
Chloride: 98 mmol/L (ref 98–111)
Creatinine, Ser: 0.83 mg/dL (ref 0.61–1.24)
GFR, Estimated: >60 mL/min (ref >60)
Glucose, Bld: 88 mg/dL (ref 70–99)
Potassium: 4.2 mmol/L (ref 3.5–5.1)
Sodium: 131 mmol/L — ABNORMAL LOW (ref 135–145)
Total Bilirubin: 0.6 mg/dL (ref 0.3–1.2)
Total Protein: 6.5 g/dL (ref 6.5–8.1)

## 2022-09-14 LAB — CBC WITH DIFFERENTIAL/PLATELET
Abs Immature Granulocytes: 0.02 10*3/uL (ref 0.00–0.07)
Basophils Absolute: 0 10*3/uL (ref 0.0–0.1)
Basophils Relative: 1 %
Eosinophils Absolute: 0.1 10*3/uL (ref 0.0–0.5)
Eosinophils Relative: 1 %
HCT: 38.4 % — ABNORMAL LOW (ref 39.0–52.0)
Hemoglobin: 13.8 g/dL (ref 13.0–17.0)
Immature Granulocytes: 0 %
Lymphocytes Relative: 15 %
Lymphs Abs: 1 10*3/uL (ref 0.7–4.0)
MCH: 32.5 pg (ref 26.0–34.0)
MCHC: 35.9 g/dL (ref 30.0–36.0)
MCV: 90.6 fL (ref 80.0–100.0)
Monocytes Absolute: 0.5 10*3/uL (ref 0.1–1.0)
Monocytes Relative: 8 %
Neutro Abs: 5 10*3/uL (ref 1.7–7.7)
Neutrophils Relative %: 75 %
Platelets: 206 10*3/uL (ref 150–400)
RBC: 4.24 MIL/uL (ref 4.22–5.81)
RDW: 12.2 % (ref 11.5–15.5)
WBC: 6.6 10*3/uL (ref 4.0–10.5)
nRBC: 0 % (ref 0.0–0.2)

## 2022-09-14 LAB — TYPE AND SCREEN
ABO/RH(D): B POS
Antibody Screen: NEGATIVE

## 2022-09-14 LAB — SURGICAL PCR SCREEN
MRSA, PCR: NEGATIVE
Staphylococcus aureus: NEGATIVE

## 2022-09-14 NOTE — Patient Instructions (Addendum)
Your procedure is scheduled on:09-23-22 Wednesday Report to the Registration Desk on the 1st floor of the [facility].Then proceed to the 2nd floor Surgery Desk To find out your arrival time, please call [phone] between 1PM - 3PM on:09-22-22 Tuesday If your arrival time is 6:00 am, do not arrive prior to that time as the [facility] entrance doors do not open until 6:00 am.  REMEMBER: Instructions that are not followed completely may result in serious medical risk, up to and including death; or upon the discretion of your surgeon and anesthesiologist your surgery may need to be rescheduled.  Do not eat food after midnight the night before surgery.  No gum chewing, lozengers or hard candies.  You may however, drink CLEAR liquids up to 2 hours before you are scheduled to arrive for your surgery. Do not drink anything within 2 hours of your scheduled arrival time.  Clear liquids include: - water  - apple juice without pulp - gatorade (not RED colors) - black coffee or tea (Do NOT add milk or creamers to the coffee or tea) Do NOT drink anything that is not on this list.  In addition, your doctor has ordered for you to drink the provided  Ensure Pre-Surgery Clear Carbohydrate Drink  Drinking this carbohydrate drink up to two hours before surgery helps to reduce insulin resistance and improve patient outcomes. Please complete drinking 2 hours prior to scheduled arrival time.  TAKE THESE MEDICATIONS THE MORNING OF SURGERY WITH A SIP OF WATER: -cetirizine (ZYRTEC)  -DULoxetine (CYMBALTA)  -metoprolol succinate (TOPROL-XL)  -rosuvastatin (CRESTOR)   Stop your lopidogrel (PLAVIX) and 81 mg Aspirin 5 days prior to surgery per Dr Wannetta Sender dose will be on 09-17-22 Thursday  One week prior to surgery: Stop Anti-inflammatories (NSAIDS) such as Advil, Aleve, Ibuprofen, Motrin, Naproxen, Naprosyn and Aspirin based products such as Excedrin, Goodys Powder, BC Powder.You may however, continue  to take Tylenol if needed for pain up until the day of surgery.  Stop ANY OVER THE COUNTER supplements/vitamins 7 days prior to surgery (Vitamin C, Biotin, Calcium-D, Vitamin D3, Magnesium Oxide, Glucosamine-Chondroitin, Fish Oil and Vitamin B12)-You may continue your Melatonin up until the night prior to surgery  No Alcohol for 24 hours before or after surgery.  No Smoking including e-cigarettes for 24 hours prior to surgery.  No chewable tobacco products for at least 6 hours prior to surgery.  No nicotine patches on the day of surgery.  Do not use any "recreational" drugs for at least a week prior to your surgery.  Please be advised that the combination of cocaine and anesthesia may have negative outcomes, up to and including death. If you test positive for cocaine, your surgery will be cancelled.  On the morning of surgery brush your teeth with toothpaste and water, you may rinse your mouth with mouthwash if you wish. Do not swallow any toothpaste or mouthwash.  Use CHG Soap as directed on instruction sheet.  Do not wear jewelry, make-up, hairpins, clips or nail polish.  Do not wear lotions, powders, or perfumes.   Do not shave body from the neck down 48 hours prior to surgery just in case you cut yourself which could leave a site for infection.  Also, freshly shaved skin may become irritated if using the CHG soap.  Contact lenses, hearing aids and dentures may not be worn into surgery.  Do not bring valuables to the hospital. [facility] is not responsible for any missing/lost belongings or valuables.  Notify your doctor if there is any change in your medical condition (cold, fever, infection).  Wear comfortable clothing (specific to your surgery type) to the hospital.  After surgery, you can help prevent lung complications by doing breathing exercises.  Take deep breaths and cough every 1-2 hours. Your doctor may order a device called an Incentive Spirometer to help you take  deep breaths. When coughing or sneezing, hold a pillow firmly against your incision with both hands. This is called "splinting." Doing this helps protect your incision. It also decreases belly discomfort.  If you are being admitted to the hospital overnight, leave your suitcase in the car. After surgery it may be brought to your room.  If you are being discharged the day of surgery, you will not be allowed to drive home. You will need a responsible adult (18 years or older) to drive you home and stay with you that night.   If you are taking public transportation, you will need to have a responsible adult (18 years or older) with you. Please confirm with your physician that it is acceptable to use public transportation.   Please call the [facility] at [phone] if you have any questions about these instructions.  Surgery Visitation Policy:  Patients undergoing a surgery or procedure may have two family members or support persons with them as long as the person is not COVID-19 positive or experiencing its symptoms.   Inpatient Visitation:    Visiting hours are 7 a.m. to 8 p.m. Up to four visitors are allowed at one time in a patient room, including children. The visitors may rotate out with other people during the day. One designated support person (adult) may remain overnight.   How to Use an Incentive Spirometer An incentive spirometer is a tool that measures how well you are filling your lungs with each breath. Learning to take long, deep breaths using this tool can help you keep your lungs clear and active. This may help to reverse or lessen your chance of developing breathing (pulmonary) problems, especially infection. You may be asked to use a spirometer: After a surgery. If you have a lung problem or a history of smoking. After a long period of time when you have been unable to move or be active. If the spirometer includes an indicator to show the highest  number that you have reached, your health care provider or respiratory therapist will help you set a goal. Keep a log of your progress as told by your health care provider. What are the risks? Breathing too quickly may cause dizziness or cause you to pass out. Take your time so you do not get dizzy or light-headed. If you are in pain, you may need to take pain medicine before doing incentive spirometry. It is harder to take a deep breath if you are having pain. How to use your incentive spirometer  Sit up on the edge of your bed or on a chair. Hold the incentive spirometer so that it is in an upright position. Before you use the spirometer, breathe out normally. Place the mouthpiece in your mouth. Make sure your lips are closed tightly around it. Breathe in slowly and as deeply as you can through your mouth, causing the piston or the ball to rise toward the top of the chamber. Hold your breath for 3-5 seconds, or for as long as possible. If the spirometer includes a coach indicator, use this to guide you in breathing. Slow down your  breathing if the indicator goes above the marked areas. Remove the mouthpiece from your mouth and breathe out normally. The piston or ball will return to the bottom of the chamber. Rest for a few seconds, then repeat the steps 10 or more times. Take your time and take a few normal breaths between deep breaths so that you do not get dizzy or light-headed. Do this every 1-2 hours when you are awake. If the spirometer includes a goal marker to show the highest number you have reached (best effort), use this as a goal to work toward during each repetition. After each set of 10 deep breaths, cough a few times. This will help to make sure that your lungs are clear. If you have an incision on your chest or abdomen from surgery, place a pillow or a rolled-up towel firmly against the incision when you cough. This can help to reduce pain while taking deep breaths and  coughing. General tips When you are able to get out of bed: Walk around often. Continue to take deep breaths and cough in order to clear your lungs. Keep using the incentive spirometer until your health care provider says it is okay to stop using it. If you have been in the hospital, you may be told to keep using the spirometer at home. Contact a health care provider if: You are having difficulty using the spirometer. You have trouble using the spirometer as often as instructed. Your pain medicine is not giving enough relief for you to use the spirometer as told. You have a fever. Get help right away if: You develop shortness of breath. You develop a cough with bloody mucus from the lungs. You have fluid or blood coming from an incision site after you cough. Summary An incentive spirometer is a tool that can help you learn to take long, deep breaths to keep your lungs clear and active. You may be asked to use a spirometer after a surgery, if you have a lung problem or a history of smoking, or if you have been inactive for a long period of time. Use your incentive spirometer as instructed every 1-2 hours while you are awake. If you have an incision on your chest or abdomen, place a pillow or a rolled-up towel firmly against your incision when you cough. This will help to reduce pain. Get help right away if you have shortness of breath, you cough up bloody mucus, or blood comes from your incision when you cough. This information is not intended to replace advice given to you by your health care provider. Make sure you discuss any questions you have with your health care provider. Document Revised: 01/15/2020 Document Reviewed: 01/15/2020 Elsevier Patient Education  [place].

## 2022-09-21 ENCOUNTER — Encounter: Payer: Self-pay | Admitting: Surgery

## 2022-09-21 NOTE — Progress Notes (Signed)
Perioperative Services  Pre-Admission/Anesthesia Testing Clinical Review  Date: 09/21/22  Patient Demographics:  Name: Luke Glenn DOB:   Jul 16, 1961 MRN:   [id number]  Planned Surgical Procedure(s):    Case: [id number] Date/Time: 09/23/22 0715   Procedure: TOTAL KNEE ARTHROPLASTY (Right: Knee)   Anesthesia type: Choice   Pre-op diagnosis: Primary osteoarthritis of right knee M17.11   Location: [facility] OR ROOM 02 / [place] [facility] FOR ANESTHESIA GROUP   Surgeons: Corky Mull, MD   NOTE: Available PAT nursing documentation and vital signs have been reviewed. Clinical nursing staff has updated patient's PMH/PSHx, current medication list, and drug allergies/intolerances to ensure comprehensive history available to assist in medical decision making as it pertains to the aforementioned surgical procedure and anticipated anesthetic course. Extensive review of available clinical information performed. Luke Glenn PMH and PSHx updated with any diagnoses/procedures that  may have been inadvertently omitted during his intake with the pre-admission testing department's nursing staff.  Clinical Discussion:  Luke Glenn is a 61 y.o. male who is submitted for pre-surgical anesthesia review and clearance prior to him undergoing the above procedure. Patient is a Former Smoker (10 pack years; quit 11/2000). Pertinent PMH includes: CAD (s/p multiple PCIs), anterior STEMI, HFpEF, HTN, HLD, lumbosacral DDD, BPH, sleep difficulties.   Patient is followed by cardiology Loyal Buba, MD). He was last seen in the cardiology clinic on 06/23/2022; notes reviewed. At the time of his clinic visit, the patient denied any chest pain, shortness of breath, PND, orthopnea, palpitations, significant peripheral edema, vertiginous symptoms, or presyncope/syncope.  Patient with a PMH significant for cardiovascular diagnoses.  Patient suffered an anterior wall STEMI on 07/12/2001.  He was transferred from [facility] to [facility] where he underwent  PCI that revealed significant single-vessel CAD.  LVEF was 53%.  There was a 95% stenosis of the mid LAD, which was treated with placement of a 2.5 x 18 mm Cordis BX Velocity stent.  Diagnostic left heart catheterization performed on 11/25/2005.  LVEF was normal with mild anterior hypokinesis.  LVEDP 13 mmHg.  CAD noted, 25% ostial left main, 25% proximal LCx, 50% ISR of the LAD, 25% proximal D1, 95% proximal D2, and 25% RCA.  PCI was performed and a 2.5 x 16 mm Taxus DES x 1 placed to the proximal D2.  Diagnostic left heart catheterization repeated on 02/22/2007 revealing 20% stenosis of the LM, 25% stenosis of the proximal LCx, and 30% of the RCA.  Previously placed stent to the proximal D2 without evidence of ISR.  There was 90% ISR within the previously placed mid LAD stent, which was treated by placement of a 2.5 x 20 mm Taxus DES x 1.  Patient underwent diagnostic left heart catheterization on 09/19/2007 revealing an LVEF of 55%.  There was single-vessel CAD noted with 60% occlusion of the mid LAD.  FFR was performed and found to be low at 0.73-0.75.  PCI performed and a 2.5 x 18 Xience DES x 1 placed.  Most recent TTE performed on 06/20/2021 revealed mild left ventricular systolic function with an EF of 50%.  There was normal LA pressures with normal diastolic function.  There was trivial to mild mitral, pulmonary, and tricuspid valve regurgitation.  No evidence of valvular stenosis.  Left ventricular septal wall hypokinetic.  Patient on daily DAPT therapy (ASA + clopidogrel); compliant with therapy with no evidence of GI bleeding.  Blood pressure well controlled at 109/63 on currently prescribed ARB (losartan) and beta-blocker (metoprolol succinate) therapies.  Patient is on rosuvastatin  for his HLD dia further ASCVD prevention.  He is not diabetic. Patient does not have an OSAH diagnosis. Overall functional capacity limited by back pain, however patient still felt to be able to achieve at least 4  METS of activity without angina/anginal equivalent symptoms.  No changes were made to patient's medication regimen.  Patient to follow-up with outpatient cardiology in 1 year or sooner if needed.  Luke Glenn is scheduled for an elective RIGHT TOTAL KNEE ARTHROPLASTY on 09/23/2022 with Dr. Milagros Evener, MD.  Given patient's past medical history significant for cardiovascular diagnoses, presurgical cardiac clearance was sought by the PAT team. Per cardiology, "this patient is optimized for surgery and may proceed with the planned procedural course with a LOW risk of significant perioperative cardiovascular complications".  Again, this patient is on daily DAPT therapy.  Patient has been advised on recommendations from his cardiologist for holding his clopidogrel for 7 days prior to his procedure with plans to restart as soon as postoperative bleeding risk felt to be minimized by his primary attending surgeon. The patient is aware that his last dose of clopidogrel should be on 09/15/2022. He has been asked to continue his daily low dose ASA throughout the perioperative course.   Patient reporting previous perioperative complications with anesthesia in the past. He attributes his 2 days of postoperative hiccoughs to anesthesia received for knee arthroscopy in 12/2021. In review of his EMR, it is noted the patient underwent a general anesthetic course here (ASA III) and 12/2021 with no documented complications.     09/14/2022    1:36 PM 08/18/2022    9:25 AM 07/28/2022    1:56 PM  Vitals with BMI  Height '5\' 9"'$  '5\' 9"'$  '5\' 8"'$   Weight 199 lbs 15 oz 189 lbs 189 lbs 3 oz  BMI 29.52 68.3 41.96  Systolic 222 979 892  Diastolic 85 84 82  Pulse 67 90     Providers/Specialists:   NOTE: Primary physician provider listed below. Patient may have been seen by APP or partner within same practice.   PROVIDER ROLE / SPECIALTY LAST OV  Poggi, Marshall Cork, MD Orthopedics (Surgeon) 08/28/2022  Leonel Ramsay, MD  Primary Care Provider 09/09/2022  Lovena Neighbours, MD  Cardiology 06/23/2022   Allergies:  Patient has no known allergies.  Current Home Medications:   No current facility-administered medications for this encounter.    alendronate (FOSAMAX) 70 MG tablet   Ascorbic Acid (VITAMIN C) 1000 MG tablet   aspirin 81 MG tablet   azelastine (ASTELIN) 0.1 % nasal spray   Biotin 5000 MCG CAPS   calcitonin, salmon, (MIACALCIN/FORTICAL) 200 UNIT/ACT nasal spray   Calcium Citrate-Vitamin D 315-5 MG-MCG TABS   cetirizine (ZYRTEC) 10 MG tablet   Cholecalciferol (VITAMIN D3) 50 MCG (2000 UT) TABS   clopidogrel (PLAVIX) 75 MG tablet   DULoxetine (CYMBALTA) 30 MG capsule   finasteride (PROSCAR) 5 MG tablet   fluticasone (FLONASE) 50 MCG/ACT nasal spray   ibuprofen (ADVIL) 200 MG tablet   losartan (COZAAR) 50 MG tablet   magnesium oxide (MAG-OX) 400 (240 Mg) MG tablet   Melatonin 10 MG TABS   metoprolol succinate (TOPROL-XL) 25 MG 24 hr tablet   Misc Natural Products (GLUCOSAMINE CHOND MSM FORMULA PO)   Omega-3 Fatty Acids (FISH OIL) 1000 MG CAPS   rosuvastatin (CRESTOR) 20 MG tablet   traZODone (DESYREL) 50 MG tablet   vitamin B-12 (CYANOCOBALAMIN) 1000 MCG tablet   History:   Past Medical History:  Diagnosis  Date   (HFpEF) heart failure with preserved ejection fraction ([place])    a.) TTE 10/14/2017: EF 45%; mild global dysfunction; triv AR/PR, mild MR/TR. b.) TTE 06/20/2021: EF 50%; GLS -16.1%; mild LV dysfunction; septal HL; triv MR, mild TR/PR.   BPH (benign prostatic hyperplasia)    Chronic anticoagulation    a.) on DAPT therapy (ASA + clopidogrel)   Complication of anesthesia    hiccups x 2 days after knee scope in feb 2023   Coronary artery disease    a.) PCI 07/11/2001: 95% mLAD -->  2.5 x 18 mm Cordis BX Velocity to mLAD. b.) PCI 11/25/2005: 50% ISR mLAD; 95% pD2 --> 2.5 x 16 mm Taxus DES to pD2. c.) PCI 02/22/2007: 80% ISR mLAD --> 2.5 x 20 mm Taxus DES to mLAD. d.) PCI 09/19/2007:  60% ISR mLAD; FFR low at 0.73-0.75 --> 2.5 x 18 mm Xience DES to mLAD.   COVID-19 07/2022   DDD (degenerative disc disease), lumbosacral    a.) s/p L4-L5 fusion with interbody spacer placement   Elevated lipids    History of heart artery stent    a.) total of 4 stents: 2.5 x 18 mm Cordis BX Velocity mLAD (07/2001), 2.5 x 16 mm Taxus pD2 (11/2005), 2.5 x 20 mm Taxus mLAD (02/2007), 2.5 x 18 mm Xience DES mLAD (09/2007).   Hypertension    Left renal atrophy    Sleep difficulties    a.) takes melatonin   ST elevation myocardial infarction (STEMI) of anterior wall ([place]) 07/12/2001   a.) transferred to [facility]; PCI --> LVEF 53%; 95% mLAD - 2.5 x 18 mm Cordis BX Velocity stent placed.   Valvular regurgitation    a.) TTE on 06/20/2021 --> mild TR, PR; trivial MR   Past Surgical History:  Procedure Laterality Date   COLONOSCOPY WITH ESOPHAGOGASTRODUODENOSCOPY (EGD)     COLONOSCOPY WITH PROPOFOL N/A 12/25/2015   Procedure: COLONOSCOPY WITH PROPOFOL;  Surgeon: Manya Silvas, MD;  Location: [place];  Service: Endoscopy;  Laterality: N/A;   CORONARY ANGIOPLASTY WITH STENT PLACEMENT Left 07/12/2001   Procedure: LEFT HEART CATHETERIZATION; PCI WITH STENT PLACEMENT (2.5 x 18 mm Cordis BX Velocity stent x 1 to mLAD); Location: [facility]; Surgeon: Pura Spice, MD   CORONARY ANGIOPLASTY WITH STENT PLACEMENT Left 02/22/2007   Procedure: LEFT HEART CATHETERIZATION; PCI WITH STENT PLACEMENT (2.5 x 20 mm Taxus DES to mLAD); Location: [facility]; Surgeon: Lovena Neighbours, MD   CORONARY ANGIOPLASTY WITH STENT PLACEMENT Left 09/19/2007   Procedure: LEFT HEART CATHETERIZATION; PCI WITH STENT PLACEMENT (2.5 x 18 mm Xience DES to mLAD); Location: [facility]; Surgeon: Joeseph Amor, MD   CORONARY ANGIOPLASTY WITH STENT PLACEMENT Left 11/25/2005   Procedure: LEFT HEART CATHETERIZATION; PCI WITH STENT PLACEMENT (2.5 x 16 mm Taxus DES to pD2); Location: [facility]; Surgeon: ???, MD   KNEE ARTHROSCOPY Right 12/22/2021   Procedure:  ARTHROSCOPY KNEE;  Surgeon: Dereck Leep, MD;  Location: [facility] ORS;  Service: Orthopedics;  Laterality: Right;   MAXIMUM ACCESS (MAS) TRANSFORAMINAL LUMBAR INTERBODY FUSION (TLIF) 1 LEVEL N/A 07/30/2021   Procedure: OPEN L4-5 TRANSFORAMINAL LUMBAR INTERBODY FUSION (TLIF) 1 LEVEL;  Surgeon: Meade Maw, MD;  Location: [facility] ORS;  Service: Neurosurgery;  Laterality: N/A;   MENISCECTOMY Right 02/08/1995   Procedure: MEDIAL MENISCECTOMY; Location: [facility]; Surgeon: Kelli Hope, MD   Family History  Problem Relation Age of Onset   Dementia Mother    Diabetes Father    Heart attack Father    Bladder Cancer Father  Stroke Maternal Grandmother    Heart attack Maternal Grandmother    Cancer Maternal Grandfather        unknown type   Gout Maternal Grandfather    Diabetes Paternal Grandmother    Heart attack Paternal Grandfather    Kidney cancer Neg Hx    Prostate cancer Neg Hx    Social History   Tobacco Use   Smoking status: Former    Packs/day: 0.50    Years: 20.00    Total pack years: 10.00    Types: Cigars, Cigarettes    Quit date: 2002    Years since quitting: 21.8    Passive exposure: Past   Smokeless tobacco: Never  Vaping Use   Vaping Use: Never used  Substance Use Topics   Alcohol use: Yes    Comment: 1-2 beers less than 3 times a week   Drug use: No    Pertinent Clinical Results:  LABS: Labs reviewed: Acceptable for surgery.  Component Date Value Ref Range Status   WBC 09/14/2022 6.6  4.0 - 10.5 K/uL Final   RBC 09/14/2022 4.24  4.22 - 5.81 MIL/uL Final   Hemoglobin 09/14/2022 13.8  13.0 - 17.0 g/dL Final   HCT 09/14/2022 38.4 (L)  39.0 - 52.0 % Final   MCV 09/14/2022 90.6  80.0 - 100.0 fL Final   MCH 09/14/2022 32.5  26.0 - 34.0 pg Final   MCHC 09/14/2022 35.9  30.0 - 36.0 g/dL Final   RDW 09/14/2022 12.2  11.5 - 15.5 % Final   Platelets 09/14/2022 206  150 - 400 K/uL Final   nRBC 09/14/2022 0.0  0.0 - 0.2 % Final   Neutrophils Relative % 09/14/2022  75  % Final   Neutro Abs 09/14/2022 5.0  1.7 - 7.7 K/uL Final   Lymphocytes Relative 09/14/2022 15  % Final   Lymphs Abs 09/14/2022 1.0  0.7 - 4.0 K/uL Final   Monocytes Relative 09/14/2022 8  % Final   Monocytes Absolute 09/14/2022 0.5  0.1 - 1.0 K/uL Final   Eosinophils Relative 09/14/2022 1  % Final   Eosinophils Absolute 09/14/2022 0.1  0.0 - 0.5 K/uL Final   Basophils Relative 09/14/2022 1  % Final   Basophils Absolute 09/14/2022 0.0  0.0 - 0.1 K/uL Final   Immature Granulocytes 09/14/2022 0  % Final   Abs Immature Granulocytes 09/14/2022 0.02  0.00 - 0.07 K/uL Final   Performed at [facility], [place], [place] [address]   Sodium 09/14/2022 131 (L)  135 - 145 mmol/L Final   Potassium 09/14/2022 4.2  3.5 - 5.1 mmol/L Final   Chloride 09/14/2022 98  98 - 111 mmol/L Final   CO2 09/14/2022 25  22 - 32 mmol/L Final   Glucose, Bld 09/14/2022 88  70 - 99 mg/dL Final   Glucose reference range applies only to samples taken after fasting for at least 8 hours.   BUN 09/14/2022 14  8 - 23 mg/dL Final   Creatinine, Ser 09/14/2022 0.83  0.61 - 1.24 mg/dL Final   Calcium 09/14/2022 8.6 (L)  8.9 - 10.3 mg/dL Final   Total Protein 09/14/2022 6.5  6.5 - 8.1 g/dL Final   Albumin 09/14/2022 3.7  3.5 - 5.0 g/dL Final   AST 09/14/2022 30  15 - 41 U/L Final   ALT 09/14/2022 35  0 - 44 U/L Final   Alkaline Phosphatase 09/14/2022 79  38 - 126 U/L Final   Total Bilirubin 09/14/2022 0.6  0.3 - 1.2 mg/dL Final   GFR, Estimated 09/14/2022 >60  >60 mL/min Final   Comment: (NOTE) Calculated using the CKD-EPI Creatinine Equation (2021)    Anion gap 09/14/2022 8  5 - 15 Final   Performed at [facility], [place], [place] [address]   Color, Urine 09/14/2022 COLORLESS (A)  YELLOW Final   APPearance 09/14/2022 CLEAR (A)  CLEAR Final   Specific Gravity, Urine 09/14/2022 1.003 (L)  1.005 - 1.030 Final   pH 09/14/2022 7.0  5.0 - 8.0 Final   Glucose, UA  09/14/2022 NEGATIVE  NEGATIVE mg/dL Final   Hgb urine dipstick 09/14/2022 NEGATIVE  NEGATIVE Final   Bilirubin Urine 09/14/2022 NEGATIVE  NEGATIVE Final   Ketones, ur 09/14/2022 NEGATIVE  NEGATIVE mg/dL Final   Protein, ur 09/14/2022 NEGATIVE  NEGATIVE mg/dL Final   Nitrite 09/14/2022 NEGATIVE  NEGATIVE Final   Leukocytes,Ua 09/14/2022 NEGATIVE  NEGATIVE Final   Performed at [facility], [address] [place], [place] [address]   ABO/RH(D) 09/14/2022 B POS   Final   Antibody Screen 09/14/2022 NEG   Final   Sample Expiration 09/14/2022 09/28/2022,2359   Final   Extend sample reason 09/14/2022    Final                   Value:NO TRANSFUSIONS OR PREGNANCY IN THE PAST 3 MONTHS Performed at [facility], [address] [place], [place] [address]    MRSA, PCR 09/14/2022 NEGATIVE  NEGATIVE Final   Staphylococcus aureus 09/14/2022 NEGATIVE  NEGATIVE Final   Comment: (NOTE) The Xpert SA Assay (FDA approved for NASAL specimens in patients 44 years of age and older), is one component of a comprehensive surveillance program. It is not intended to diagnose infection nor to guide or monitor treatment. Performed at [facility], [address] [place], [place] [address]    ECG: Date: 09/14/2022 Time: 1401 PM Rate: 59 bpm Rhythm: Sinus bradycardia with first-degree AV block Intervals: PR 212 ms. QRS 84 ms. QTc 388 ms. ST segment and T wave changes: Minimal STE in lead V3 ectopy secondary to early repolarization. Evidence of an age undetermined anteroseptal infarct present. Comparison: Similar to previous tracing obtained on 06/20/2021   IMAGING / PROCEDURES: DIAGNOSTIC RADIOGRAPHS OF RIGHT KNEE 1-2 VIEWS performed on 08/13/2022 Moderate degenerative changes of the knee with medial compartment joint space narrowing.   Lateral compartment appears relatively well-preserved.   There appears to be very small osteophyte formation about the medial tibial plateau.    Of note contralateral left knee appears to have relatively well-preserved joint spaces.   MRI KNEE RIGHT WO CONTRAST performed on 06/28/2022 Diminutive medial meniscus body related to interval partial meniscectomy.  New complex tearing of the posterior body and undersurface fraying of the posterior horn. Progressive mild patellofemoral and moderate medial compartment osteoarthritis.  DG LUMBAR SPINE COMPLETE performed on 06/25/2022 Posterior lumbar fusion hardware at L4-L5 with interbody spacer. No evidence of hardware complication. Minimal grade 1 retrolisthesis of L1 on L2, L2 on L3, and L3 on L4. No abnormal motion. Mild L1-L2 degenerative disc disease.   TRANSTHORACIC ECHOCARDIOGRAM performed on 06/20/2021 Mild left ventricular systolic dysfunction with an EF of 50% Left ventricular GLS -16.1% Normal LA pressures with normal diastolic function Normal right ventricular systolic function Trivial MR Mild TR and PR No AR No valvular stenosis Hypokinetic left ventricular septal wall Mild right ventricular and atrial enlargement No evidence of pericardial effusion  LEFT HEART CATHETERIZATION AND  CORONARY ANGIOGRAPHY performed on 09/19/2007 LVEF 55% 60% ISR of the previously placed stents to the mid LAD  FFR low at 0.73-0.75 PCI performed and a 2.5 x 18 mm Xience DES x 1 placed to the mid LAD   LEFT HEART CATHETERIZATION AND CORONARY ANGIOGRAPHY performed on 02/22/2007 LVEF 55% 80% ISR of the previously placed stent to the mid LAD PCI performed and a 2.5 x 20 mm Taxus DES placed to the mid LAD  LEFT HEART CATHETERIZATION AND CORONARY ANGIOGRAPHY performed on 11/25/2005 Normal left ventricular systolic function with mild anterior hypokinesis LVEDP 13 Multivessel CAD with significant disease in single vessel 25% ostial LM 25% proximal LCx 50% ISR of previously placed mid LAD stent 25% proximal D1 95% proximal D2 25% RCA PCI performed and a 2.5 x 16 mm Taxus DES was placed  to the proximal D2.   LEFT HEART CATHETERIZATION AND CORONARY ANGIOGRAPHY performed on 07/12/2001 LVEF 53% 95% stenosis mid LAD PCI performed and 2.5 x 18 mm Cordis BX Velocity stent placed to mid LAD.   Impression and Plan:  JAQUISE FAUX has been referred for pre-anesthesia review and clearance prior to him undergoing the planned anesthetic and procedural courses. Available labs, pertinent testing, and imaging results were personally reviewed by me. This patient has been appropriately cleared by cardiology with an overall LOW risk of significant perioperative cardiovascular complications.  Based on clinical review performed today (09/21/22), barring any significant acute changes in the patient's overall condition, it is anticipated that he will be able to proceed with the planned surgical intervention. Any acute changes in clinical condition may necessitate his procedure being postponed and/or cancelled. Patient will meet with anesthesia team (MD and/or CRNA) on the day of his procedure for preoperative evaluation/assessment. Questions regarding anesthetic course will be fielded at that time.   Pre-surgical instructions were reviewed with the patient during his PAT appointment  and questions were fielded by PAT clinical staff. Patient was advised that if any questions or concerns arise prior to his procedure then he should return a call to PAT and/or his surgeon's office to discuss.  Honor Loh, MSN, APRN, FNP-C, CEN [facility]  Peri-operative Services Nurse Practitioner Phone: [phone] Fax: [phone] 09/21/22 10:18 AM  NOTE: This note has been prepared using Dragon dictation software. Despite my best ability to proofread, there is always the potential that unintentional transcriptional errors may still occur from this process.

## 2022-09-23 ENCOUNTER — Encounter
Admission: RE | Disposition: A | Discharge status: Home / Self Care | Payer: Self-pay | Source: Home / Self Care | Attending: Surgery

## 2022-09-23 ENCOUNTER — Encounter: Payer: Self-pay | Admitting: Surgery

## 2022-09-23 ENCOUNTER — Ambulatory Visit: Payer: Managed Care, Other (non HMO)

## 2022-09-23 ENCOUNTER — Ambulatory Visit: Payer: Managed Care, Other (non HMO) | Admitting: Urgent Care

## 2022-09-23 ENCOUNTER — Other Ambulatory Visit: Payer: Self-pay

## 2022-09-23 ENCOUNTER — Ambulatory Visit
Admission: RE | Admit: 2022-09-23 | Discharge: 2022-09-23 | Disposition: A | Discharge status: Home / Self Care | Payer: Managed Care, Other (non HMO) | Attending: Surgery | Admitting: Surgery

## 2022-09-23 DIAGNOSIS — I110 Hypertensive heart disease with heart failure: Secondary | ICD-10-CM | POA: Diagnosis not present

## 2022-09-23 DIAGNOSIS — I10 Essential (primary) hypertension: Secondary | ICD-10-CM

## 2022-09-23 DIAGNOSIS — M1711 Unilateral primary osteoarthritis, right knee: Secondary | ICD-10-CM | POA: Insufficient documentation

## 2022-09-23 DIAGNOSIS — N401 Enlarged prostate with lower urinary tract symptoms: Secondary | ICD-10-CM

## 2022-09-23 DIAGNOSIS — I5032 Chronic diastolic (congestive) heart failure: Secondary | ICD-10-CM | POA: Diagnosis not present

## 2022-09-23 DIAGNOSIS — I2510 Atherosclerotic heart disease of native coronary artery without angina pectoris: Secondary | ICD-10-CM | POA: Insufficient documentation

## 2022-09-23 HISTORY — DX: Sleep disorder, unspecified: G47.9

## 2022-09-23 HISTORY — PX: TOTAL KNEE ARTHROPLASTY: SHX125

## 2022-09-23 SURGERY — ARTHROPLASTY, KNEE, TOTAL
Anesthesia: General | Site: Knee | Laterality: Right

## 2022-09-23 MED ORDER — LACTATED RINGERS IV SOLN: INTRAVENOUS | Status: DC

## 2022-09-23 MED ORDER — PHENYLEPHRINE HCL (PRESSORS) 10 MG/ML IV SOLN
INTRAVENOUS | Status: DC | PRN
  Administered 2022-09-23 (×4): 80 ug via INTRAVENOUS

## 2022-09-23 MED ORDER — ONDANSETRON HCL 4 MG/2ML IJ SOLN: 4.0000 mg | Freq: Once | INTRAMUSCULAR | Status: DC | PRN

## 2022-09-23 MED ORDER — FLUTICASONE PROPIONATE 50 MCG/ACT NA SUSP: 2.0000 | NASAL | Status: AC | PRN

## 2022-09-23 MED ORDER — SODIUM CHLORIDE 0.9 % IR SOLN
Status: DC | PRN
  Administered 2022-09-23: 3000 mL

## 2022-09-23 MED ORDER — LIDOCAINE HCL (CARDIAC) PF 100 MG/5ML IV SOSY
PREFILLED_SYRINGE | INTRAVENOUS | Status: DC | PRN
  Administered 2022-09-23: 100 mg via INTRAVENOUS

## 2022-09-23 MED ORDER — ACETAMINOPHEN 10 MG/ML IV SOLN: 1000.0000 mg | Freq: Once | INTRAVENOUS | Status: DC | PRN

## 2022-09-23 MED ORDER — ONDANSETRON HCL 4 MG PO TABS: 4.0000 mg | ORAL_TABLET | Freq: Four times a day (QID) | ORAL | Status: DC | PRN

## 2022-09-23 MED ORDER — PROPOFOL 10 MG/ML IV BOLUS
INTRAVENOUS | Status: AC
  Filled 2022-09-23: qty 20

## 2022-09-23 MED ORDER — PROPOFOL 10 MG/ML IV BOLUS
INTRAVENOUS | Status: DC | PRN
  Administered 2022-09-23: 150 mg via INTRAVENOUS

## 2022-09-23 MED ORDER — METOPROLOL SUCCINATE ER 25 MG PO TB24: 25.0000 mg | ORAL_TABLET | ORAL | Status: AC

## 2022-09-23 MED ORDER — FAMOTIDINE 20 MG PO TABS
ORAL_TABLET | ORAL | Status: AC
  Administered 2022-09-23: 20 mg via ORAL
  Filled 2022-09-23: qty 1

## 2022-09-23 MED ORDER — OXYCODONE HCL 5 MG PO TABS
ORAL_TABLET | ORAL | Status: AC
  Filled 2022-09-23: qty 1

## 2022-09-23 MED ORDER — CHLORHEXIDINE GLUCONATE 0.12 % MT SOLN
OROMUCOSAL | Status: AC
  Administered 2022-09-23: 15 mL via OROMUCOSAL
  Filled 2022-09-23: qty 15

## 2022-09-23 MED ORDER — ONDANSETRON HCL 4 MG/2ML IJ SOLN
INTRAMUSCULAR | Status: DC | PRN
  Administered 2022-09-23: 4 mg via INTRAVENOUS

## 2022-09-23 MED ORDER — OXYCODONE HCL 5 MG/5ML PO SOLN: 5.0000 mg | Freq: Once | ORAL | Status: AC | PRN

## 2022-09-23 MED ORDER — ACETAMINOPHEN 10 MG/ML IV SOLN
INTRAVENOUS | Status: DC | PRN
  Administered 2022-09-23: 1000 mg via INTRAVENOUS

## 2022-09-23 MED ORDER — FENTANYL CITRATE (PF) 100 MCG/2ML IJ SOLN
INTRAMUSCULAR | Status: AC
  Administered 2022-09-23: 25 ug via INTRAVENOUS
  Filled 2022-09-23: qty 2

## 2022-09-23 MED ORDER — MIDAZOLAM HCL 2 MG/2ML IJ SOLN
INTRAMUSCULAR | Status: AC
  Filled 2022-09-23: qty 2

## 2022-09-23 MED ORDER — KETOROLAC TROMETHAMINE 15 MG/ML IJ SOLN
INTRAMUSCULAR | Status: AC
  Filled 2022-09-23: qty 1

## 2022-09-23 MED ORDER — FINASTERIDE 5 MG PO TABS: 5.0000 mg | ORAL_TABLET | Freq: Every evening | ORAL | Status: AC

## 2022-09-23 MED ORDER — AZELASTINE HCL 0.1 % NA SOLN: 1.0000 | NASAL | Status: AC | PRN

## 2022-09-23 MED ORDER — KETAMINE HCL 10 MG/ML IJ SOLN
INTRAMUSCULAR | Status: DC | PRN
  Administered 2022-09-23: 30 mg via INTRAVENOUS

## 2022-09-23 MED ORDER — KETOROLAC TROMETHAMINE 30 MG/ML IJ SOLN
INTRAMUSCULAR | Status: AC
  Filled 2022-09-23: qty 1

## 2022-09-23 MED ORDER — APIXABAN 2.5 MG PO TABS: 2.5000 mg | ORAL_TABLET | Freq: Two times a day (BID) | ORAL | 0 refills | Status: DC

## 2022-09-23 MED ORDER — MIDAZOLAM HCL 2 MG/2ML IJ SOLN
INTRAMUSCULAR | Status: DC | PRN
  Administered 2022-09-23: 2 mg via INTRAVENOUS

## 2022-09-23 MED ORDER — EPHEDRINE 5 MG/ML INJ
INTRAVENOUS | Status: AC
  Filled 2022-09-23: qty 5

## 2022-09-23 MED ORDER — TRIAMCINOLONE ACETONIDE 40 MG/ML IJ SUSP
INTRAMUSCULAR | Status: AC
  Filled 2022-09-23: qty 2

## 2022-09-23 MED ORDER — 0.9 % SODIUM CHLORIDE (POUR BTL) OPTIME
TOPICAL | Status: DC | PRN
  Administered 2022-09-23: 500 mL

## 2022-09-23 MED ORDER — FENTANYL CITRATE (PF) 100 MCG/2ML IJ SOLN
INTRAMUSCULAR | Status: AC
  Filled 2022-09-23: qty 2

## 2022-09-23 MED ORDER — TRANEXAMIC ACID 1000 MG/10ML IV SOLN
INTRAVENOUS | Status: DC | PRN
  Administered 2022-09-23: 1000 mg via TOPICAL

## 2022-09-23 MED ORDER — BUPIVACAINE-EPINEPHRINE (PF) 0.5% -1:200000 IJ SOLN
INTRAMUSCULAR | Status: AC
  Filled 2022-09-23: qty 30

## 2022-09-23 MED ORDER — SODIUM CHLORIDE 0.9 % IV SOLN
INTRAVENOUS | Status: DC
  Administered 2022-09-23: 500 mL via INTRAVENOUS

## 2022-09-23 MED ORDER — TRIAMCINOLONE ACETONIDE 40 MG/ML IJ SUSP
INTRAMUSCULAR | Status: DC | PRN
  Administered 2022-09-23: 93 mL

## 2022-09-23 MED ORDER — OXYCODONE HCL 5 MG PO TABS
5.0000 mg | ORAL_TABLET | Freq: Once | ORAL | Status: AC | PRN
  Administered 2022-09-23: 5 mg via ORAL

## 2022-09-23 MED ORDER — BUPIVACAINE LIPOSOME 1.3 % IJ SUSP
INTRAMUSCULAR | Status: AC
  Filled 2022-09-23: qty 20

## 2022-09-23 MED ORDER — DEXAMETHASONE SODIUM PHOSPHATE 10 MG/ML IJ SOLN
INTRAMUSCULAR | Status: AC
  Filled 2022-09-23: qty 1

## 2022-09-23 MED ORDER — SODIUM CHLORIDE FLUSH 0.9 % IV SOLN
INTRAVENOUS | Status: AC
  Filled 2022-09-23: qty 40

## 2022-09-23 MED ORDER — BUPIVACAINE-EPINEPHRINE (PF) 0.25% -1:200000 IJ SOLN
INTRAMUSCULAR | Status: AC
  Filled 2022-09-23: qty 30

## 2022-09-23 MED ORDER — PROPOFOL 1000 MG/100ML IV EMUL
INTRAVENOUS | Status: AC
  Filled 2022-09-23: qty 100

## 2022-09-23 MED ORDER — TRANEXAMIC ACID 1000 MG/10ML IV SOLN
INTRAVENOUS | Status: AC
  Filled 2022-09-23: qty 10

## 2022-09-23 MED ORDER — CHLORHEXIDINE GLUCONATE 0.12 % MT SOLN: 15.0000 mL | Freq: Once | OROMUCOSAL | Status: AC

## 2022-09-23 MED ORDER — CEFAZOLIN SODIUM-DEXTROSE 2-4 GM/100ML-% IV SOLN
2.0000 g | INTRAVENOUS | Status: AC
  Administered 2022-09-23: 2 g via INTRAVENOUS

## 2022-09-23 MED ORDER — METOCLOPRAMIDE HCL 10 MG PO TABS: 5.0000 mg | ORAL_TABLET | Freq: Three times a day (TID) | ORAL | Status: DC | PRN

## 2022-09-23 MED ORDER — DEXAMETHASONE SODIUM PHOSPHATE 10 MG/ML IJ SOLN
INTRAMUSCULAR | Status: DC | PRN
  Administered 2022-09-23: 10 mg via INTRAVENOUS

## 2022-09-23 MED ORDER — KETAMINE HCL 50 MG/5ML IJ SOSY
PREFILLED_SYRINGE | INTRAMUSCULAR | Status: AC
  Filled 2022-09-23: qty 5

## 2022-09-23 MED ORDER — EPHEDRINE SULFATE (PRESSORS) 50 MG/ML IJ SOLN
INTRAMUSCULAR | Status: DC | PRN
  Administered 2022-09-23: 5 mg via INTRAVENOUS

## 2022-09-23 MED ORDER — ROCURONIUM BROMIDE 100 MG/10ML IV SOLN
INTRAVENOUS | Status: DC | PRN
  Administered 2022-09-23: 20 mg via INTRAVENOUS
  Administered 2022-09-23: 60 mg via INTRAVENOUS

## 2022-09-23 MED ORDER — CEFAZOLIN SODIUM-DEXTROSE 2-4 GM/100ML-% IV SOLN
INTRAVENOUS | Status: AC
  Administered 2022-09-23: 2 g via INTRAVENOUS
  Filled 2022-09-23: qty 100

## 2022-09-23 MED ORDER — CEFAZOLIN SODIUM-DEXTROSE 2-4 GM/100ML-% IV SOLN: 2.0000 g | Freq: Four times a day (QID) | INTRAVENOUS | Status: DC

## 2022-09-23 MED ORDER — CEFAZOLIN SODIUM-DEXTROSE 2-4 GM/100ML-% IV SOLN
INTRAVENOUS | Status: AC
  Filled 2022-09-23: qty 100

## 2022-09-23 MED ORDER — KETOROLAC TROMETHAMINE 15 MG/ML IJ SOLN
15.0000 mg | Freq: Once | INTRAMUSCULAR | Status: AC
  Administered 2022-09-23: 15 mg via INTRAVENOUS

## 2022-09-23 MED ORDER — SUGAMMADEX SODIUM 200 MG/2ML IV SOLN
INTRAVENOUS | Status: DC | PRN
  Administered 2022-09-23: 200 mg via INTRAVENOUS

## 2022-09-23 MED ORDER — METOCLOPRAMIDE HCL 5 MG/ML IJ SOLN: 5.0000 mg | Freq: Three times a day (TID) | INTRAMUSCULAR | Status: DC | PRN

## 2022-09-23 MED ORDER — ORAL CARE MOUTH RINSE: 15.0000 mL | Freq: Once | OROMUCOSAL | Status: AC

## 2022-09-23 MED ORDER — ACETAMINOPHEN 10 MG/ML IV SOLN
INTRAVENOUS | Status: AC
  Filled 2022-09-23: qty 100

## 2022-09-23 MED ORDER — OXYCODONE HCL 5 MG PO TABS: 5.0000 mg | ORAL_TABLET | ORAL | Status: DC | PRN

## 2022-09-23 MED ORDER — OXYCODONE HCL 5 MG PO TABS: 5.0000 mg | ORAL_TABLET | ORAL | 0 refills | Status: DC | PRN

## 2022-09-23 MED ORDER — FENTANYL CITRATE (PF) 100 MCG/2ML IJ SOLN
INTRAMUSCULAR | Status: DC | PRN
  Administered 2022-09-23: 50 ug via INTRAVENOUS
  Administered 2022-09-23: 100 ug via INTRAVENOUS

## 2022-09-23 MED ORDER — ONDANSETRON HCL 4 MG/2ML IJ SOLN: 4.0000 mg | Freq: Four times a day (QID) | INTRAMUSCULAR | Status: DC | PRN

## 2022-09-23 MED ORDER — FENTANYL CITRATE (PF) 100 MCG/2ML IJ SOLN
25.0000 ug | INTRAMUSCULAR | Status: AC | PRN
  Administered 2022-09-23 (×4): 25 ug via INTRAVENOUS

## 2022-09-23 MED ORDER — FAMOTIDINE 20 MG PO TABS: 20.0000 mg | ORAL_TABLET | Freq: Once | ORAL | Status: AC

## 2022-09-23 SURGICAL SUPPLY — 59 items
BIT DRILL QUICK REL 1/8 2PK SL (DRILL) IMPLANT
BLADE SAW SAG 25X90X1.19 (BLADE) ×1 IMPLANT
BLADE SURG SZ20 CARB STEEL (BLADE) ×1 IMPLANT
BNDG ELASTIC 6X5.8 VLCR NS LF (GAUZE/BANDAGES/DRESSINGS) ×1 IMPLANT
CEMENT BONE R 1X40 (Cement) ×2 IMPLANT
CEMENT VACUUM MIXING SYSTEM (MISCELLANEOUS) ×1 IMPLANT
CHLORAPREP W/TINT 26 (MISCELLANEOUS) ×1 IMPLANT
COMPONENT PATELLAR VGD 7.8X3 (Joint) IMPLANT
COOLER POLAR GLACIER W/PUMP (MISCELLANEOUS) ×1 IMPLANT
COVER MAYO STAND REUSABLE (DRAPES) ×1 IMPLANT
CUFF TOURN SGL QUICK 24 (TOURNIQUET CUFF)
CUFF TOURN SGL QUICK 34 (TOURNIQUET CUFF)
CUFF TRNQT CYL 24X4X16.5-23 (TOURNIQUET CUFF) IMPLANT
CUFF TRNQT CYL 34X4.125X (TOURNIQUET CUFF) IMPLANT
DRAPE 3/4 80X56 (DRAPES) ×1 IMPLANT
DRAPE IMP U-DRAPE 54X76 (DRAPES) ×1 IMPLANT
DRAPE U-SHAPE 47X51 STRL (DRAPES) ×1 IMPLANT
DRILL QUICK RELEASE 1/8 INCH (DRILL) ×3
DRSG MEPILEX SACRM 8.7X9.8 (GAUZE/BANDAGES/DRESSINGS) IMPLANT
DRSG OPSITE POSTOP 4X10 (GAUZE/BANDAGES/DRESSINGS) ×1 IMPLANT
DRSG OPSITE POSTOP 4X8 (GAUZE/BANDAGES/DRESSINGS) ×1 IMPLANT
ELECT REM PT RETURN 9FT ADLT (ELECTROSURGICAL) ×1
ELECTRODE REM PT RTRN 9FT ADLT (ELECTROSURGICAL) ×1 IMPLANT
FEMORAL COMPONENT 70MM RIGHT (Joint) IMPLANT
GAUZE XEROFORM 1X8 LF (GAUZE/BANDAGES/DRESSINGS) ×1 IMPLANT
GLOVE BIO SURGEON STRL SZ7.5 (GLOVE) ×4 IMPLANT
GLOVE BIO SURGEON STRL SZ8 (GLOVE) ×4 IMPLANT
GLOVE BIOGEL PI IND STRL 8 (GLOVE) ×1 IMPLANT
GLOVE SURG UNDER LTX SZ8 (GLOVE) ×1 IMPLANT
GOWN STRL REUS W/ TWL LRG LVL3 (GOWN DISPOSABLE) ×1 IMPLANT
GOWN STRL REUS W/ TWL XL LVL3 (GOWN DISPOSABLE) ×1 IMPLANT
GOWN STRL REUS W/TWL LRG LVL3 (GOWN DISPOSABLE) ×1
GOWN STRL REUS W/TWL XL LVL3 (GOWN DISPOSABLE) ×1
HOOD PEEL AWAY T7 (MISCELLANEOUS) ×3 IMPLANT
INSERT TIB BEARING 79X10 (Insert) IMPLANT
IV NS IRRIG 3000ML ARTHROMATIC (IV SOLUTION) ×1 IMPLANT
KIT TURNOVER KIT A (KITS) ×1 IMPLANT
MANIFOLD NEPTUNE II (INSTRUMENTS) ×1 IMPLANT
NDL SPNL 20GX3.5 QUINCKE YW (NEEDLE) ×1 IMPLANT
NEEDLE SPNL 20GX3.5 QUINCKE YW (NEEDLE) ×1 IMPLANT
NS IRRIG 1000ML POUR BTL (IV SOLUTION) ×1 IMPLANT
PACK TOTAL KNEE (MISCELLANEOUS) ×1 IMPLANT
PAD WRAPON POLAR KNEE (MISCELLANEOUS) ×1 IMPLANT
PENCIL SMOKE EVACUATOR (MISCELLANEOUS) ×1 IMPLANT
PLATE KNEE TIBIAL 79MM FIXED (Plate) IMPLANT
PULSAVAC PLUS IRRIG FAN TIP (DISPOSABLE) ×1
STAPLER SKIN PROX 35W (STAPLE) ×1 IMPLANT
SUCTION FRAZIER HANDLE 10FR (MISCELLANEOUS) ×1
SUCTION TUBE FRAZIER 10FR DISP (MISCELLANEOUS) ×1 IMPLANT
SUT VIC AB 0 CT1 36 (SUTURE) ×3 IMPLANT
SUT VIC AB 2-0 CT1 27 (SUTURE) ×3
SUT VIC AB 2-0 CT1 TAPERPNT 27 (SUTURE) ×3 IMPLANT
SYR 10ML LL (SYRINGE) ×1 IMPLANT
SYR 20ML LL LF (SYRINGE) ×1 IMPLANT
SYR 30ML LL (SYRINGE) IMPLANT
TIP FAN IRRIG PULSAVAC PLUS (DISPOSABLE) ×1 IMPLANT
TRAP FLUID SMOKE EVACUATOR (MISCELLANEOUS) ×2 IMPLANT
WATER STERILE IRR 500ML POUR (IV SOLUTION) ×1 IMPLANT
WRAPON POLAR PAD KNEE (MISCELLANEOUS) ×1

## 2022-09-23 NOTE — Op Note (Signed)
09/23/2022  10:19 AM  Patient:   Luke Glenn  Pre-Op Diagnosis:   Degenerative joint disease, right knee.  Post-Op Diagnosis:   Same  Procedure:   Right TKA using all-cemented [employer] Vanguard system with a 70 mm PCR femur, a 79 mm tibial tray with a 10 mm anterior stabilized E-poly insert, and a 34 x 7.8 mm all-poly 3-pegged domed patella.  Surgeon:   Pascal Lux, MD  Assistant:   Waylan Boga, RNFA; Reymundo Poll, PA-S  Anesthesia:   GET  Findings:   As above  Complications:   None  EBL:   5 cc  Fluids:   700 cc crystalloid  UOP:   None  TT:   100 minutes at 300 mmHg  Drains:   None  Closure:   Staples  Implants:   As above  Brief Clinical Note:   The patient is a 61 year old male with a long history of progressively worsening right knee pain. The patient's symptoms have progressed despite medications, activity modification, injections, etc. The patient's history and examination were consistent with moderate degenerative joint disease of the right knee confirmed by plain radiographs and preoperative MRI scan. The patient presents at this time for a right total knee arthroplasty.  Procedure:   The patient was brought into the operating room and lain in the supine position.  After adequate general endotracheal intubation and anesthesia were obtained, the right lower extremity was prepped with ChloraPrep solution and draped sterilely. Preoperative antibiotics were administered. A timeout was performed to verify the appropriate surgical site before the limb was exsanguinated with an Esmarch and the tourniquet inflated to 300 mmHg.   A standard anterior approach to the knee was made through an approximately 7 inch incision. The incision was carried down through the subcutaneous tissues to expose superficial retinaculum. This was split the length of the incision and the medial flap elevated sufficiently to expose the medial retinaculum. The medial retinaculum was  incised, leaving a 3-4 mm cuff of tissue on the patella. This was extended distally along the medial border of the patellar tendon and proximally through the medial third of the quadriceps tendon. A subtotal fat pad excision was performed before the soft tissues were elevated off the anteromedial and anterolateral aspects of the proximal tibia to the level of the collateral ligaments. The anterior portions of the medial and lateral menisci were removed, as was the anterior cruciate ligament. With the knee flexed to 90, the external tibial guide was positioned and the appropriate proximal tibial cut made. This piece was taken to the back table where it was measured and found to be optimally replicated by a 79 mm component.  Attention was directed to the distal femur. The intramedullary canal was accessed through a 3/8" drill hole. The intramedullary guide was inserted and positioned in order to obtain a neutral flexion gap. The intercondylar block was positioned with care taken to avoid notching the anterior cortex of the femur. The appropriate cut was made. Next, the distal cutting block was placed at 5 of valgus alignment. Using the 9 mm slot, the distal cut was made. The distal femur was measured and found to be optimally replicated by the 70 mm component. The 70 mm 4-in-1 cutting block was positioned and first the posterior, then the posterior chamfer, the anterior chamfer, and finally the anterior cuts were made. At this point, the posterior portions medial and lateral menisci were removed. A trial reduction was performed using the appropriate femoral and tibial  components with the 10 mm insert. This demonstrated excellent stability to varus and valgus stressing both in flexion and extension while permitting full extension. Patella tracking was assessed and found to be excellent. Therefore, the tibial guide position was marked on the proximal tibia. The patella thickness was measured and found to be 23 mm.  Therefore, the appropriate cut was made. The patellar surface was measured and found to be optimally replicated by the 34 mm component. The three peg holes were drilled in place before the trial button was inserted. Patella tracking was assessed and found to be excellent, passing the "no thumb test". The lug holes were drilled into the distal femur before the trial component was removed, leaving only the tibial tray. The keel was then created using the appropriate tower, reamer, and punch.  The bony surfaces were prepared for cementing by irrigating them thoroughly with sterile saline solution via the jet lavage system. A bone plug was fashioned from some of the bone that had been removed previously and used to plug the distal femoral canal. In addition, 20 cc of Exparel diluted out to 60 cc with normal saline and 30 cc of 0.5% Sensorcaine were injected into the postero-medial and postero-lateral aspects of the knee, the medial and lateral gutter regions, and the peri-incisional tissues to help with postoperative analgesia. Meanwhile, the cement was being mixed on the back table. When it was ready, the tibial tray was cemented in first. The excess cement was removed using [employer] fast streamer. Next, the femoral component was impacted into place. Again, the excess cement was removed using [employer] fast streamer. The 10 mm trial insert was positioned and the knee brought into extension while the cement hardened. Finally, the patella was cemented into place and secured using the patellar clamp. Again, the excess cement was removed using [employer] fast streamer. Once the cement had hardened, the knee was placed through a range of motion with the findings as described above. Therefore, the trial insert was removed and, after verifying that no cement had been retained posteriorly, the permanent 10 mm anterior stabilized E-polyethylene insert was positioned and secured using the appropriate key locking mechanism. Again the knee was placed  through a range of motion with the findings as described above.  The wound was copiously irrigated with sterile saline solution using the jet lavage system before the quadriceps tendon and retinacular layer were reapproximated using #0 Vicryl interrupted sutures. The superficial retinacular layer also was closed using a running #0 Vicryl suture. A total of 10 cc of transexemic acid (TXA) was injected intra-articularly before the subcutaneous tissues were closed in several layers using 2-0 Vicryl interrupted sutures. The skin was closed using staples. A sterile honeycomb dressing was applied to the skin before the leg was wrapped with an Ace wrap to accommodate the Polar Care device. The patient was then awakened, extubated, and returned to the recovery room in satisfactory condition after tolerating the procedure well.

## 2022-09-23 NOTE — Transfer of Care (Signed)
Immediate Anesthesia Transfer of Care Note  Patient: Luke Glenn  Procedure(s) Performed: TOTAL KNEE ARTHROPLASTY (Right: Knee)  Patient Location: PACU  Anesthesia Type:General  Level of Consciousness: drowsy  Airway & Oxygen Therapy: Patient Spontanous Breathing and Patient connected to face mask oxygen  Post-op Assessment: Report given to RN and Post -op Vital signs reviewed and stable  Post vital signs: Reviewed and stable  Last Vitals:  Vitals Value Taken Time  BP 116/80 09/23/22 1000  Temp 36.1 C 09/23/22 1000  Pulse 59 09/23/22 1006  Resp 12 09/23/22 1006  SpO2 100 % 09/23/22 1006  Vitals shown include unvalidated device data.  Last Pain:  Vitals:   09/23/22 1000  TempSrc:   PainSc: Asleep         Complications: No notable events documented.

## 2022-09-23 NOTE — Anesthesia Procedure Notes (Signed)
Procedure Name: Intubation Date/Time: 09/23/2022 7:41 AM  Performed by: Cammie Sickle, CRNAPre-anesthesia Checklist: Patient identified, Patient being monitored, Timeout performed, Emergency Drugs available and Suction available Patient Re-evaluated:Patient Re-evaluated prior to induction Oxygen Delivery Method: Circle system utilized Preoxygenation: Pre-oxygenation with 100% oxygen Induction Type: IV induction Ventilation: Mask ventilation without difficulty Laryngoscope Size: 3 and McGraph Grade View: Grade I Tube type: Oral Tube size: 7.0 mm Number of attempts: 1 Airway Equipment and Method: Stylet Placement Confirmation: ETT inserted through vocal cords under direct vision, positive ETCO2 and breath sounds checked- equal and bilateral Secured at: 22 cm Tube secured with: Tape Dental Injury: Teeth and Oropharynx as per pre-operative assessment

## 2022-09-23 NOTE — Discharge Instructions (Addendum)
Orthopedic discharge instructions: May shower with intact OpSite dressing. Apply ice frequently to knee or use Polar Care. Start Eliquis 1 tablet (2.5 mg) twice daily on Thursday, 09/24/2022, for 2 weeks, then may resume Plavix and aspirin. Take pain medication as prescribed when needed.  May supplement with ES Tylenol if necessary. May weight-bear as tolerated on right leg - use walker for balance and support. Follow-up in 10-14 days or as scheduled.    AMBULATORY SURGERY  DISCHARGE INSTRUCTIONS  The drugs that you were given will stay in your system until tomorrow so for the next 24 hours you should not:  Drive an automobile Make any legal decisions Drink any alcoholic beverage  You may resume regular meals tomorrow.  Today it is better to start with liquids and gradually work up to solid foods.  You may eat anything you prefer, but it is better to start with liquids, then soup and crackers, and gradually work up to solid foods.  Please notify your doctor immediately if you have any unusual bleeding, trouble breathing, redness and pain at the surgery site, drainage, fever, or pain not relieved by medication.  Additional Instructions:   Please contact your physician with any problems or Same Day Surgery at [phone], Monday through Friday 6 am to 4 pm, or [place] [facility] number at [phone].

## 2022-09-23 NOTE — Anesthesia Preprocedure Evaluation (Signed)
Anesthesia Evaluation  Patient identified by MRN, date of birth, ID band Patient awake    Reviewed: Allergy & Precautions, NPO status , Patient's Chart, lab work & pertinent test results  History of Anesthesia Complications Negative for: history of anesthetic complications  Airway Mallampati: II  TM Distance: >3 FB Neck ROM: Full    Dental  (+) Teeth Intact   Pulmonary neg pulmonary ROS, neg sleep apnea, neg COPD, Patient abstained from smoking.Not current smoker, former smoker   Pulmonary exam normal breath sounds clear to auscultation       Cardiovascular Exercise Tolerance: Good METShypertension, Pt. on medications + CAD (s/p MI and stents on Plavix, last dose 12/16/21)  (-) Past MI Normal cardiovascular exam(-) dysrhythmias  Rhythm:Regular Rate:Normal - Systolic murmurs Echo 07/02/22:  1. Mild left ventricular systolic dysfunction with an EF of 50% 2. Left ventricular GLS -16.1% 3. Normal LA pressures with normal diastolic function 4. Normal right ventricular systolic function 5. Trivial MR 6. Mild TR and PR 7. No AR 8. No valvular stenosis 9. Hypokinetic left ventricular septal wall 10. Mild right ventricular and atrial enlargement 11. No evidence of pericardial effusion  ECG 06/20/21:  Sinus rhythm  ST elevation, probably due to early repolarization  Otherwise normal ECG   Neuro/Psych negative neurological ROS  negative psych ROS   GI/Hepatic negative GI ROS,neg GERD  ,,(+)     (-) substance abuse    Endo/Other  negative endocrine ROSneg diabetes    Renal/GU Renal disease (left renal atrophy)   BPH    Musculoskeletal  (+) Arthritis ,    Abdominal   Peds  Hematology negative hematology ROS (+)   Anesthesia Other Findings Past Medical History: No date: (HFpEF) heart failure with preserved ejection fraction ([place])     Comment:  a.) TTE 10/14/2017: EF 45%; mild global dysfunction;               triv  AR/PR, mild MR/TR. b.) TTE 06/20/2021: EF 50%; GLS               -16.1%; mild LV dysfunction; septal HL; triv MR, mild               TR/PR. No date: BPH (benign prostatic hyperplasia) No date: Chronic anticoagulation     Comment:  a.) on DAPT therapy (ASA + clopidogrel) No date: Complication of anesthesia     Comment:  hiccups x 2 days after knee scope in feb 2023 No date: Coronary artery disease     Comment:  a.) PCI 07/11/2001: 95% mLAD -->  2.5 x 18 mm Cordis BX               Velocity to mLAD. b.) PCI 11/25/2005: 50% ISR mLAD; 95%               pD2 --> 2.5 x 16 mm Taxus DES to pD2. c.) PCI 02/22/2007:              80% ISR mLAD --> 2.5 x 20 mm Taxus DES to mLAD. d.) PCI               09/19/2007: 60% ISR mLAD; FFR low at 0.73-0.75 --> 2.5 x               18 mm Xience DES to mLAD. 07/2022: COVID-19 No date: DDD (degenerative disc disease), lumbosacral     Comment:  a.) s/p L4-L5 fusion with interbody spacer placement No date: Elevated lipids No date: History  of heart artery stent     Comment:  a.) total of 4 stents: 2.5 x 18 mm Cordis BX Velocity               mLAD (07/2001), 2.5 x 16 mm Taxus pD2 (11/2005), 2.5 x 20              mm Taxus mLAD (02/2007), 2.5 x 18 mm Xience DES mLAD               (09/2007). No date: Hypertension No date: Left renal atrophy No date: Sleep difficulties     Comment:  a.) takes melatonin 07/12/2001: ST elevation myocardial infarction (STEMI) of anterior  wall (HCC)     Comment:  a.) transferred to [facility]; PCI --> LVEF 53%; 95% mLAD -               2.5 x 18 mm Cordis BX Velocity stent placed. No date: Valvular regurgitation     Comment:  a.) TTE on 06/20/2021 --> mild TR, PR; trivial MR  Reproductive/Obstetrics                              Anesthesia Physical Anesthesia Plan  ASA: 3  Anesthesia Plan: General   Post-op Pain Management: Ofirmev IV (intra-op)*   Induction: Intravenous  PONV Risk Score and Plan: 3 and  Ondansetron, Dexamethasone, Treatment may vary due to age or medical condition and Midazolam  Airway Management Planned: Oral ETT  Additional Equipment: None  Intra-op Plan:   Post-operative Plan: Extubation in OR  Informed Consent: I have reviewed the patients History and Physical, chart, labs and discussed the procedure including the risks, benefits and alternatives for the proposed anesthesia with the patient or authorized representative who has indicated his/her understanding and acceptance.     Dental advisory given  Plan Discussed with: CRNA  Anesthesia Plan Comments: (Patient took plavix 6 days prior, no convincing reasons to risk elective spinal placement. Will proceed with GETA.  Discussed risks of anesthesia with patient, including PONV, sore throat, lip/dental/eye damage. Rare risks discussed as well, such as cardiorespiratory and neurological sequelae, and allergic reactions. Discussed the role of CRNA in patient's perioperative care. Patient understands.)         Anesthesia Quick Evaluation

## 2022-09-23 NOTE — Evaluation (Signed)
Physical Therapy Evaluation Patient Details Name: Luke Glenn MRN: [id number] DOB: 08-04-1961 Today's Date: 09/23/2022  History of Present Illness  Pt is a 61 yo male s/p R TKA 09/23/22. PMH of CAD, HTN, renal disease, PCI.  Clinical Impression  Pt admitted with above diagnosis. Pt received upright in recliner agreeable to PT eval with spouse present planning for SDDC. Pt and spouse assist in subjective stating indep at baseline with ambulation and ADL's/IADL's. Won't have 24/7 supervision at home.   To date, pt initially educated on correct R knee positioning, WB precautions, HEP handout with reps/sets/ frequency. Pt begins session with therex with excellent overall muscle activation and R knee mobility. Some limited R ankle DF and PF compared to LLE but able to perform RLE SLR and has full sensation to LT at foot. Min VC's needed throughout exercises for form/technique and speed of motion. Pt standing mod-I at bedside ambulating ~200' with RW step through gait minor antalgic on RLE. With VC's able to perform consistent step through pattern. Chair follow provided by spouse due to pt reporting mild "loopiness" from medication. Seated rest provided during PT demo on safe techniques to asc/desc steps with single R rail use. Pt performs 4 steps x2 times with correct LE sequencing with spouse provided with safe guarding techniques and RW positioning. Pt returned to room with education on car transfer, use of ice modality for inflammation, and STS and stand to sit transfers with RW. Pt and spouse understanding of all education provided. Pt currently appropriate for SDDC. Will plan for OPPT per surgeon recommendation at discharge. Pt currently with functional limitations due to the deficits listed below (see PT Problem List). Pt will benefit from skilled PT to increase their independence and safety with mobility to allow discharge to the venue listed below.       Recommendations for follow up therapy are  one component of a multi-disciplinary discharge planning process, led by the attending physician.  Recommendations may be updated based on patient status, additional functional criteria and insurance authorization.  Follow Up Recommendations Outpatient PT      Assistance Recommended at Discharge Intermittent Supervision/Assistance  Patient can return home with the following  A little help with walking and/or transfers;Assist for transportation;Assistance with cooking/housework;Help with stairs or ramp for entrance;A little help with bathing/dressing/bathroom    Equipment Recommendations None recommended by PT  Recommendations for Other Services       Functional Status Assessment Patient has had a recent decline in their functional status and demonstrates the ability to make significant improvements in function in a reasonable and predictable amount of time.     Precautions / Restrictions Precautions Precautions: Knee Precaution Booklet Issued: Yes (comment) Restrictions Weight Bearing Restrictions: Yes RLE Weight Bearing: Weight bearing as tolerated      Mobility  Bed Mobility               General bed mobility comments: NT. In recliner pre and post session. Patient Response: Cooperative  Transfers Overall transfer level: Needs assistance Equipment used: None Transfers: Sit to/from Stand Sit to Stand: Modified independent (Device/Increase time)           General transfer comment: use of hands on arm rests    Ambulation/Gait Ambulation/Gait assistance: Supervision Gait Distance (Feet): 200 Feet Assistive device: Rolling walker (2 wheels) Gait Pattern/deviations: Step-through pattern, Decreased step length - right, Decreased step length - left, Decreased stance time - right, Decreased dorsiflexion - right       General  Gait Details: Able to perform step through gait with consistent heel strike post VC's. Antalgic on RLE.  Stairs Stairs: Yes Stairs  assistance: Min guard Stair Management: One rail Right, Step to pattern, Forwards Number of Stairs: 4 General stair comments: asc/desc x2. PT demo prior to completion with education to spouse on guarding and RW set up. Pt safe with performance with correct sequencing.  Wheelchair Mobility    Modified Rankin (Stroke Patients Only)       Balance Overall balance assessment: Needs assistance Sitting-balance support: Bilateral upper extremity supported, Feet supported Sitting balance-Leahy Scale: Good     Standing balance support: No upper extremity supported Standing balance-Leahy Scale: Fair Standing balance comment: Can stand without UE's supported                             Pertinent Vitals/Pain Pain Assessment Pain Assessment: Faces Faces Pain Scale: Hurts little more Pain Location: R knee Pain Descriptors / Indicators: Aching, Discomfort, Grimacing Pain Intervention(s): Limited activity within patient's tolerance, Monitored during session, Repositioned, Ice applied    Home Living Family/patient expects to be discharged to:: Private residence Living Arrangements: Spouse/significant other Available Help at Discharge: Available PRN/intermittently;Family Type of Home: House Home Access: Stairs to enter Entrance Stairs-Rails: Psychiatric nurse of Steps: 4   Home Layout: Two level;Able to live on main level with bedroom/bathroom Home Equipment: Rolling Walker (2 wheels);Toilet riser      Prior Function Prior Level of Function : Independent/Modified Independent                     Hand Dominance        Extremity/Trunk Assessment   Upper Extremity Assessment Upper Extremity Assessment: Overall WFL for tasks assessed    Lower Extremity Assessment Lower Extremity Assessment: RLE deficits/detail RLE Deficits / Details: R TKA RLE Sensation: WNL;decreased light touch (decreased LT on anterior tibia)    Cervical / Trunk  Assessment Cervical / Trunk Assessment: Normal  Communication   Communication: No difficulties  Cognition Arousal/Alertness: Awake/alert Behavior During Therapy: WFL for tasks assessed/performed Overall Cognitive Status: Within Functional Limits for tasks assessed                                          General Comments      Exercises Total Joint Exercises Ankle Circles/Pumps: AROM, Strengthening, Both, 10 reps Quad Sets: AROM, Strengthening, Right, 10 reps Short Arc Quad: AROM, Strengthening, Right, 10 reps Heel Slides: AROM, Strengthening, Right, 10 reps Hip ABduction/ADduction: AROM, Strengthening, Right, 10 reps Straight Leg Raises: AROM, Strengthening, Right, 10 reps Long Arc Quad: AROM, Strengthening, Right, 10 reps Other Exercises Other Exercises: Role of PT in acute setting, d/c recs, stair training, safe use of DME, WB precautions, car transfer, HEP packet with reps/sets/frequency, correct positioning of R knee to prevent contracture.   Assessment/Plan    PT Assessment Patient needs continued PT services  PT Problem List Decreased strength;Decreased range of motion;Decreased knowledge of use of DME;Pain;Decreased mobility       PT Treatment Interventions DME instruction;Balance training;Gait training;Neuromuscular re-education;Stair training;Functional mobility training;Patient/family education;Therapeutic activities;Therapeutic exercise    PT Goals (Current goals can be found in the Care Plan section)  Acute Rehab PT Goals Patient Stated Goal: To go home when safe to do so PT Goal Formulation: With patient/family Time For Goal Achievement:  10/07/22 Potential to Achieve Goals: Good    Frequency BID     Co-evaluation               AM-PAC PT "6 Clicks" Mobility  Outcome Measure Help needed turning from your back to your side while in a flat bed without using bedrails?: None Help needed moving from lying on your back to sitting on the  side of a flat bed without using bedrails?: None Help needed moving to and from a bed to a chair (including a wheelchair)?: A Little Help needed standing up from a chair using your arms (e.g., wheelchair or bedside chair)?: A Little Help needed to walk in hospital room?: A Little Help needed climbing 3-5 steps with a railing? : A Little 6 Click Score: 20    End of Session Equipment Utilized During Treatment: Gait belt Activity Tolerance: Patient tolerated treatment well Patient left: in chair;with call bell/phone within reach;with family/visitor present;with nursing/sitter in room Nurse Communication: Mobility status PT Visit Diagnosis: Muscle weakness (generalized) (M62.81);Pain Pain - Right/Left: Right Pain - part of body: Knee    Time: 2025-4270 PT Time Calculation (min) (ACUTE ONLY): 41 min   Charges:   PT Evaluation $PT Eval Low Complexity: 1 Low PT Treatments $Gait Training: 8-22 mins $Therapeutic Exercise: 8-22 mins       Laketia Vicknair M. Fairly IV, PT, DPT Physical Therapist- [facility]  09/23/2022, 2:50 PM
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
## 2022-09-24 NOTE — Anesthesia Postprocedure Evaluation (Signed)
Anesthesia Post Note  Patient: Luke Glenn  Procedure(s) Performed: TOTAL KNEE ARTHROPLASTY (Right: Knee)  Patient location during evaluation: PACU Anesthesia Type: General Level of consciousness: awake and alert Pain management: pain level controlled Vital Signs Assessment: post-procedure vital signs reviewed and stable Respiratory status: spontaneous breathing, nonlabored ventilation, respiratory function stable and patient connected to nasal cannula oxygen Cardiovascular status: blood pressure returned to baseline and stable Postop Assessment: no apparent nausea or vomiting Anesthetic complications: no   No notable events documented.   Last Vitals:  Vitals:   09/23/22 1239 09/23/22 1339  BP: 129/85 126/83  Pulse: 63 66  Resp: 14 16  Temp:    SpO2: 97% 99%    Last Pain:  Vitals:   09/23/22 1339  TempSrc:   PainSc: 0-No pain                 Arita Miss

## 2023-03-08 ENCOUNTER — Other Ambulatory Visit: Payer: Self-pay | Admitting: Infectious Diseases

## 2023-03-08 ENCOUNTER — Ambulatory Visit
Admission: RE | Admit: 2023-03-08 | Discharge: 2023-03-08 | Disposition: A | Discharge status: Home / Self Care | Payer: Managed Care, Other (non HMO) | Source: Ambulatory Visit | Attending: Infectious Diseases | Admitting: Infectious Diseases

## 2023-03-08 DIAGNOSIS — W1800XA Striking against unspecified object with subsequent fall, initial encounter: Secondary | ICD-10-CM

## 2023-03-08 DIAGNOSIS — M79605 Pain in left leg: Secondary | ICD-10-CM

## 2023-03-08 DIAGNOSIS — M79662 Pain in left lower leg: Secondary | ICD-10-CM | POA: Insufficient documentation

## 2023-03-08 DIAGNOSIS — M7989 Other specified soft tissue disorders: Secondary | ICD-10-CM | POA: Diagnosis present

## 2023-08-16 ENCOUNTER — Other Ambulatory Visit: Payer: Self-pay

## 2023-08-16 DIAGNOSIS — R399 Unspecified symptoms and signs involving the genitourinary system: Secondary | ICD-10-CM

## 2023-08-16 DIAGNOSIS — Z125 Encounter for screening for malignant neoplasm of prostate: Secondary | ICD-10-CM

## 2023-08-16 DIAGNOSIS — N401 Enlarged prostate with lower urinary tract symptoms: Secondary | ICD-10-CM

## 2023-08-17 ENCOUNTER — Ambulatory Visit: Payer: Managed Care, Other (non HMO) | Admitting: Urology

## 2023-08-17 ENCOUNTER — Other Ambulatory Visit
Admission: RE | Admit: 2023-08-17 | Discharge: 2023-08-17 | Disposition: A | Discharge status: Home / Self Care | Payer: Managed Care, Other (non HMO) | Attending: Urology | Admitting: Urology

## 2023-08-17 ENCOUNTER — Encounter: Payer: Self-pay | Admitting: Urology

## 2023-08-17 VITALS — BP 119/74 | HR 65 | Ht 69.0 in | Wt 180.0 lb

## 2023-08-17 DIAGNOSIS — Z125 Encounter for screening for malignant neoplasm of prostate: Secondary | ICD-10-CM | POA: Insufficient documentation

## 2023-08-17 DIAGNOSIS — R399 Unspecified symptoms and signs involving the genitourinary system: Secondary | ICD-10-CM | POA: Insufficient documentation

## 2023-08-17 DIAGNOSIS — N401 Enlarged prostate with lower urinary tract symptoms: Secondary | ICD-10-CM | POA: Diagnosis present

## 2023-08-17 DIAGNOSIS — R351 Nocturia: Secondary | ICD-10-CM | POA: Insufficient documentation

## 2023-08-17 LAB — URINALYSIS, COMPLETE (UACMP) WITH MICROSCOPIC
Bacteria, UA: NONE SEEN
Bilirubin Urine: NEGATIVE
Glucose, UA: NEGATIVE mg/dL
Ketones, ur: NEGATIVE mg/dL
Leukocytes,Ua: NEGATIVE
Nitrite: NEGATIVE
Protein, ur: NEGATIVE mg/dL
Specific Gravity, Urine: 1.015 (ref 1.005–1.030)
Squamous Epithelial / HPF: NONE SEEN /[HPF] (ref 0–5)
WBC, UA: NONE SEEN WBC/hpf (ref 0–5)
pH: 6 (ref 5.0–8.0)

## 2023-08-17 LAB — BLADDER SCAN AMB NON-IMAGING

## 2023-08-17 NOTE — Progress Notes (Signed)
08/17/2023 9:01 AM   Randie Heinz Mar 25, 1961 [id number]  Reason for visit: Follow up urinary symptoms, nocturia, PSA screening, family history of bladder cancer, chronic left renal atrophy  HPI: 62 year old male we have followed for the above issues.  He has chronic left renal atrophy that is unchanged from 2011 with no left-sided flank pain and normal renal function, most recent creatinine 0.8, EGFR greater than 60 from July 2024.  He continues to have some nocturia 1-3 times at night, but has improved by cutting back on carbonated and diet drinks in the evening.  He also is using trazodone to help with sleep which has helped getting back to sleep.  Overall satisfied with his urinary symptoms.  PVR today normal again today at 35ml.  PSA remains normal at 0.07, corrected for finasteride 0.14.  Reassurance provided.  Urinalysis today benign with no microscopic hematuria, with his family history of bladder cancer he prefers to monitor yearly urine sample.  At this point he is comfortable with follow-up with PCP, return precautions were discussed including gross hematuria or worsening urinary symptoms.  -Behavioral strategies discussed regarding urinary symptoms, avoid bladder irritants, nocturia handout provided -Continue finasteride through PCP -Can continue yearly UA with PCP to evaluate for microscopic hematuria with his family history of bladder cancer -Follow-up with urology as needed   Sondra Come, MD  [facility] [address] [place], [place] [address] [phone]

## 2023-08-17 NOTE — Patient Instructions (Signed)
Avoid diet drinks and carbonation, especially in the evening as this can contribute to frequent urination.  If you have blood in the urine please let us know.  Nocturia refers to the need to wake up during the night to urinate, which can disrupt your sleep and impact your overall well-being. Fortunately, there are several strategies you can employ to help prevent or manage nocturia. It's important to consult with your healthcare provider before making any significant changes to your routine. Here are some helpful strategies to consider:  Limit Fluid Intake Before Bed: Avoid drinking large amounts of fluids in the evening, especially within a few hours of bedtime. Consume most of your daily fluid intake earlier in the day to reduce the need to urinate at night.  Monitor Your Diet: Limit your intake of caffeine and alcohol, as these substances can increase urine production and irritate the bladder.  Avoid diet, "zero calorie," and artificially sweetened drinks, especially sodas, in the afternoon or evening. Be mindful of consuming foods and drinks with high water content before bedtime, such as watermelon and herbal teas.  Time Your Medications: If you're taking medications that contribute to increased urination, consult your healthcare provider about adjusting the timing of these medications to minimize their impact during the night.  Practice Double Voiding: Before going to bed, make an effort to empty your bladder twice within a short period. This can help reduce the amount of urine left in your bladder before sleep.  Bladder Training: Gradually increase the time between bathroom visits during the day to train your bladder to hold larger volumes of urine. Over time, this can help reduce the frequency of nighttime awakenings to urinate.  Elevate Your Legs During the Day: Elevating your legs during the day can help minimize fluid retention in your lower extremities, which might reduce nighttime  urination.  Pelvic Floor Exercises: Strengthening your pelvic floor muscles through Kegel exercises can help improve bladder control and potentially reduce the urge to urinate at night.  Create a Relaxing Bedtime Routine: Stress and anxiety can exacerbate nocturia. Engage in calming activities before bed, such as reading, listening to soothing music, or practicing relaxation techniques.  Stay Active: Engage in regular physical activity, but avoid intense exercise close to bedtime, as this can increase your body's demand for fluids.  Maintain a Healthy Weight: Excess weight can compress the bladder and contribute to bladder and urinary issues. Aim to achieve and maintain a healthy weight through a balanced diet and regular exercise.  Remember that every individual is unique, and the effectiveness of these strategies may vary. It's important to work with your healthcare provider to develop a plan that suits your specific needs and addresses any underlying causes of nocturia.

## 2024-02-15 DIAGNOSIS — H1033 Unspecified acute conjunctivitis, bilateral: Secondary | ICD-10-CM | POA: Diagnosis not present

## 2024-02-15 DIAGNOSIS — J019 Acute sinusitis, unspecified: Secondary | ICD-10-CM | POA: Diagnosis not present

## 2024-05-22 DIAGNOSIS — N401 Enlarged prostate with lower urinary tract symptoms: Secondary | ICD-10-CM | POA: Diagnosis not present

## 2024-05-22 DIAGNOSIS — I2510 Atherosclerotic heart disease of native coronary artery without angina pectoris: Secondary | ICD-10-CM | POA: Diagnosis not present

## 2024-05-22 DIAGNOSIS — Z125 Encounter for screening for malignant neoplasm of prostate: Secondary | ICD-10-CM | POA: Diagnosis not present

## 2024-05-22 DIAGNOSIS — Z1331 Encounter for screening for depression: Secondary | ICD-10-CM | POA: Diagnosis not present

## 2024-05-22 DIAGNOSIS — I10 Essential (primary) hypertension: Secondary | ICD-10-CM | POA: Diagnosis not present

## 2024-05-22 DIAGNOSIS — R351 Nocturia: Secondary | ICD-10-CM | POA: Diagnosis not present

## 2024-05-22 DIAGNOSIS — E7800 Pure hypercholesterolemia, unspecified: Secondary | ICD-10-CM | POA: Diagnosis not present

## 2024-05-22 DIAGNOSIS — G8929 Other chronic pain: Secondary | ICD-10-CM | POA: Diagnosis not present

## 2024-05-22 DIAGNOSIS — Z0000 Encounter for general adult medical examination without abnormal findings: Secondary | ICD-10-CM | POA: Diagnosis not present

## 2024-05-22 DIAGNOSIS — F32A Depression, unspecified: Secondary | ICD-10-CM | POA: Diagnosis not present

## 2024-05-22 DIAGNOSIS — G4730 Sleep apnea, unspecified: Secondary | ICD-10-CM | POA: Diagnosis not present

## 2024-05-22 DIAGNOSIS — M8580 Other specified disorders of bone density and structure, unspecified site: Secondary | ICD-10-CM | POA: Diagnosis not present

## 2024-06-29 DIAGNOSIS — I10 Essential (primary) hypertension: Secondary | ICD-10-CM | POA: Diagnosis not present

## 2024-06-29 DIAGNOSIS — I5189 Other ill-defined heart diseases: Secondary | ICD-10-CM | POA: Diagnosis not present

## 2024-06-29 DIAGNOSIS — E7800 Pure hypercholesterolemia, unspecified: Secondary | ICD-10-CM | POA: Diagnosis not present

## 2024-06-29 DIAGNOSIS — I2510 Atherosclerotic heart disease of native coronary artery without angina pectoris: Secondary | ICD-10-CM | POA: Diagnosis not present
